# Patient Record
Sex: Female | Born: 1985 | Race: White | Hispanic: No | Marital: Married | Smoking: Former smoker
Health system: Southern US, Community
[De-identification: ages and names within clinical notes are randomized; demographics above are authoritative.]

## PROBLEM LIST (undated history)

## (undated) DIAGNOSIS — IMO0002 Reserved for concepts with insufficient information to code with codable children: Secondary | ICD-10-CM

## (undated) DIAGNOSIS — Z975 Presence of (intrauterine) contraceptive device: Secondary | ICD-10-CM

## (undated) DIAGNOSIS — Z8379 Family history of other diseases of the digestive system: Secondary | ICD-10-CM

## (undated) DIAGNOSIS — R87619 Unspecified abnormal cytological findings in specimens from cervix uteri: Secondary | ICD-10-CM

## (undated) DIAGNOSIS — B009 Herpesviral infection, unspecified: Secondary | ICD-10-CM

## (undated) DIAGNOSIS — R87629 Unspecified abnormal cytological findings in specimens from vagina: Secondary | ICD-10-CM

## (undated) DIAGNOSIS — K5900 Constipation, unspecified: Secondary | ICD-10-CM

## (undated) DIAGNOSIS — R42 Dizziness and giddiness: Secondary | ICD-10-CM

## (undated) HISTORY — DX: Dizziness and giddiness: R42

## (undated) HISTORY — DX: Unspecified abnormal cytological findings in specimens from cervix uteri: R87.619

## (undated) HISTORY — DX: Family history of other diseases of the digestive system: Z83.79

## (undated) HISTORY — DX: Presence of (intrauterine) contraceptive device: Z97.5

## (undated) HISTORY — PX: CERVICAL CONE BIOPSY: SUR198

## (undated) HISTORY — DX: Reserved for concepts with insufficient information to code with codable children: IMO0002

## (undated) HISTORY — PX: OTHER SURGICAL HISTORY: SHX169

## (undated) HISTORY — DX: Herpesviral infection, unspecified: B00.9

## (undated) HISTORY — DX: Unspecified abnormal cytological findings in specimens from vagina: R87.629

## (undated) HISTORY — DX: Constipation, unspecified: K59.00

---
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
## 2006-02-18 ENCOUNTER — Emergency Department (HOSPITAL_COMMUNITY): Admission: EM | Admit: 2006-02-18 | Discharge: 2006-02-19 | Payer: Self-pay | Admitting: Emergency Medicine

## 2006-03-02 ENCOUNTER — Ambulatory Visit: Payer: Self-pay | Admitting: Obstetrics & Gynecology

## 2006-03-30 ENCOUNTER — Ambulatory Visit: Payer: Self-pay | Admitting: Obstetrics & Gynecology

## 2006-04-27 ENCOUNTER — Ambulatory Visit (HOSPITAL_COMMUNITY): Admission: RE | Admit: 2006-04-27 | Discharge: 2006-04-27 | Payer: Self-pay | Admitting: *Deleted

## 2006-04-27 ENCOUNTER — Ambulatory Visit: Payer: Self-pay | Admitting: Obstetrics & Gynecology

## 2006-05-13 ENCOUNTER — Ambulatory Visit (HOSPITAL_COMMUNITY): Admission: RE | Admit: 2006-05-13 | Discharge: 2006-05-13 | Payer: Self-pay | Admitting: *Deleted

## 2006-05-25 ENCOUNTER — Ambulatory Visit: Payer: Self-pay | Admitting: Obstetrics & Gynecology

## 2006-06-16 ENCOUNTER — Ambulatory Visit: Payer: Self-pay | Admitting: *Deleted

## 2006-06-16 ENCOUNTER — Inpatient Hospital Stay (HOSPITAL_COMMUNITY): Admission: AD | Admit: 2006-06-16 | Discharge: 2006-06-16 | Payer: Self-pay | Admitting: Gynecology

## 2006-06-26 ENCOUNTER — Inpatient Hospital Stay (HOSPITAL_COMMUNITY): Admission: AD | Admit: 2006-06-26 | Discharge: 2006-06-26 | Payer: Self-pay | Admitting: Family Medicine

## 2006-06-29 ENCOUNTER — Ambulatory Visit: Payer: Self-pay | Admitting: Obstetrics & Gynecology

## 2006-07-13 ENCOUNTER — Ambulatory Visit: Payer: Self-pay | Admitting: Obstetrics & Gynecology

## 2006-08-03 ENCOUNTER — Ambulatory Visit: Payer: Self-pay | Admitting: Obstetrics & Gynecology

## 2006-08-12 ENCOUNTER — Inpatient Hospital Stay (HOSPITAL_COMMUNITY): Admission: AD | Admit: 2006-08-12 | Discharge: 2006-08-12 | Payer: Self-pay | Admitting: Obstetrics and Gynecology

## 2006-08-12 ENCOUNTER — Ambulatory Visit: Payer: Self-pay | Admitting: *Deleted

## 2006-08-12 ENCOUNTER — Inpatient Hospital Stay (HOSPITAL_COMMUNITY): Admission: AD | Admit: 2006-08-12 | Discharge: 2006-08-12 | Payer: Self-pay | Admitting: Family Medicine

## 2006-08-17 ENCOUNTER — Ambulatory Visit: Payer: Self-pay | Admitting: Obstetrics & Gynecology

## 2006-08-24 ENCOUNTER — Ambulatory Visit: Payer: Self-pay | Admitting: Obstetrics & Gynecology

## 2006-09-08 ENCOUNTER — Ambulatory Visit: Payer: Self-pay | Admitting: Family Medicine

## 2006-09-09 ENCOUNTER — Inpatient Hospital Stay (HOSPITAL_COMMUNITY): Admission: AD | Admit: 2006-09-09 | Discharge: 2006-09-09 | Payer: Self-pay | Admitting: Gynecology

## 2006-09-14 ENCOUNTER — Ambulatory Visit: Payer: Self-pay | Admitting: Obstetrics & Gynecology

## 2006-09-21 ENCOUNTER — Ambulatory Visit: Payer: Self-pay | Admitting: Obstetrics & Gynecology

## 2006-09-22 ENCOUNTER — Ambulatory Visit: Payer: Self-pay | Admitting: *Deleted

## 2006-09-22 ENCOUNTER — Inpatient Hospital Stay (HOSPITAL_COMMUNITY): Admission: AD | Admit: 2006-09-22 | Discharge: 2006-09-22 | Payer: Self-pay | Admitting: Family Medicine

## 2006-09-28 ENCOUNTER — Ambulatory Visit: Payer: Self-pay | Admitting: Obstetrics & Gynecology

## 2006-10-03 ENCOUNTER — Ambulatory Visit: Payer: Self-pay | Admitting: Obstetrics & Gynecology

## 2006-10-05 ENCOUNTER — Ambulatory Visit: Payer: Self-pay | Admitting: Obstetrics & Gynecology

## 2006-10-08 ENCOUNTER — Inpatient Hospital Stay (HOSPITAL_COMMUNITY): Admission: AD | Admit: 2006-10-08 | Discharge: 2006-10-12 | Payer: Self-pay | Admitting: Obstetrics & Gynecology

## 2006-10-08 ENCOUNTER — Ambulatory Visit: Payer: Self-pay | Admitting: Obstetrics and Gynecology

## 2006-10-09 ENCOUNTER — Encounter (INDEPENDENT_AMBULATORY_CARE_PROVIDER_SITE_OTHER): Payer: Self-pay | Admitting: Specialist

## 2006-10-15 ENCOUNTER — Inpatient Hospital Stay (HOSPITAL_COMMUNITY): Admission: AD | Admit: 2006-10-15 | Discharge: 2006-10-15 | Payer: Self-pay | Admitting: Gynecology

## 2006-10-15 ENCOUNTER — Ambulatory Visit: Payer: Self-pay | Admitting: Gynecology

## 2006-12-07 ENCOUNTER — Ambulatory Visit: Payer: Self-pay | Admitting: Family Medicine

## 2006-12-09 ENCOUNTER — Ambulatory Visit: Payer: Self-pay | Admitting: Family Medicine

## 2006-12-31 ENCOUNTER — Emergency Department (HOSPITAL_COMMUNITY): Admission: EM | Admit: 2006-12-31 | Discharge: 2006-12-31 | Payer: Self-pay | Admitting: Emergency Medicine

## 2007-02-06 ENCOUNTER — Emergency Department (HOSPITAL_COMMUNITY): Admission: EM | Admit: 2007-02-06 | Discharge: 2007-02-06 | Payer: Self-pay | Admitting: Emergency Medicine

## 2007-02-12 ENCOUNTER — Emergency Department (HOSPITAL_COMMUNITY): Admission: EM | Admit: 2007-02-12 | Discharge: 2007-02-12 | Payer: Self-pay | Admitting: Emergency Medicine

## 2007-03-14 ENCOUNTER — Emergency Department (HOSPITAL_COMMUNITY): Admission: EM | Admit: 2007-03-14 | Discharge: 2007-03-14 | Payer: Self-pay | Admitting: Emergency Medicine

## 2007-05-13 ENCOUNTER — Observation Stay (HOSPITAL_COMMUNITY): Admission: AD | Admit: 2007-05-13 | Discharge: 2007-05-14 | Payer: Self-pay | Admitting: Gynecology

## 2007-05-13 ENCOUNTER — Ambulatory Visit: Payer: Self-pay | Admitting: Gynecology

## 2007-06-12 ENCOUNTER — Ambulatory Visit (HOSPITAL_COMMUNITY): Admission: RE | Admit: 2007-06-12 | Discharge: 2007-06-12 | Payer: Self-pay | Admitting: Obstetrics

## 2007-08-17 ENCOUNTER — Ambulatory Visit: Admission: RE | Admit: 2007-08-17 | Discharge: 2007-08-17 | Payer: Self-pay | Admitting: Obstetrics

## 2007-10-06 ENCOUNTER — Inpatient Hospital Stay (HOSPITAL_COMMUNITY): Admission: AD | Admit: 2007-10-06 | Discharge: 2007-10-06 | Payer: Self-pay | Admitting: Obstetrics

## 2007-11-03 ENCOUNTER — Inpatient Hospital Stay (HOSPITAL_COMMUNITY): Admission: RE | Admit: 2007-11-03 | Discharge: 2007-11-05 | Payer: Self-pay | Admitting: Obstetrics & Gynecology

## 2008-02-28 ENCOUNTER — Emergency Department (HOSPITAL_COMMUNITY): Admission: EM | Admit: 2008-02-28 | Discharge: 2008-02-28 | Payer: Self-pay | Admitting: Family Medicine

## 2010-07-13 ENCOUNTER — Emergency Department (HOSPITAL_COMMUNITY): Admission: EM | Admit: 2010-07-13 | Discharge: 2010-07-13 | Payer: Self-pay | Admitting: Emergency Medicine

## 2010-10-08 ENCOUNTER — Other Ambulatory Visit: Admission: RE | Admit: 2010-10-08 | Discharge: 2010-10-08 | Payer: Self-pay | Admitting: Obstetrics and Gynecology

## 2010-11-16 ENCOUNTER — Emergency Department (HOSPITAL_COMMUNITY): Admission: EM | Admit: 2010-11-16 | Discharge: 2010-11-17 | Payer: Self-pay | Admitting: Emergency Medicine

## 2010-11-25 ENCOUNTER — Ambulatory Visit (HOSPITAL_COMMUNITY)
Admission: RE | Admit: 2010-11-25 | Discharge: 2010-11-25 | Payer: Self-pay | Source: Home / Self Care | Admitting: Family Medicine

## 2010-12-23 ENCOUNTER — Ambulatory Visit (HOSPITAL_COMMUNITY)
Admission: RE | Admit: 2010-12-23 | Discharge: 2010-12-23 | Payer: Self-pay | Source: Home / Self Care | Attending: Obstetrics & Gynecology | Admitting: Obstetrics & Gynecology

## 2011-03-01 LAB — COMPREHENSIVE METABOLIC PANEL
ALT: 15 U/L (ref 0–35)
AST: 20 U/L (ref 0–37)
Albumin: 4.3 g/dL (ref 3.5–5.2)
Alkaline Phosphatase: 46 U/L (ref 39–117)
BUN: 12 mg/dL (ref 6–23)
CO2: 29 mEq/L (ref 19–32)
Calcium: 9.4 mg/dL (ref 8.4–10.5)
Chloride: 101 mEq/L (ref 96–112)
Creatinine, Ser: 0.64 mg/dL (ref 0.4–1.2)
GFR calc Af Amer: >60 mL/min (ref >60)
GFR calc non Af Amer: >60 mL/min (ref >60)
Glucose, Bld: 83 mg/dL (ref 70–99)
Potassium: 3.7 mEq/L (ref 3.5–5.1)
Sodium: 138 mEq/L (ref 135–145)
Total Bilirubin: 0.6 mg/dL (ref 0.3–1.2)
Total Protein: 6.8 g/dL (ref 6.0–8.3)

## 2011-03-01 LAB — CBC
HCT: 37.3 % (ref 36.0–46.0)
Hemoglobin: 13 g/dL (ref 12.0–15.0)
MCH: 30.7 pg (ref 26.0–34.0)
MCHC: 34.9 g/dL (ref 30.0–36.0)
MCV: 88.2 fL (ref 78.0–100.0)
Platelets: 191 10*3/uL (ref 150–400)
RBC: 4.23 MIL/uL (ref 3.87–5.11)
RDW: 13.1 % (ref 11.5–15.5)
WBC: 6 10*3/uL (ref 4.0–10.5)

## 2011-03-01 LAB — URINALYSIS, ROUTINE W REFLEX MICROSCOPIC
Bilirubin Urine: NEGATIVE
Glucose, UA: NEGATIVE mg/dL
Hgb urine dipstick: NEGATIVE
Ketones, ur: NEGATIVE mg/dL
Nitrite: NEGATIVE
Protein, ur: NEGATIVE mg/dL
Specific Gravity, Urine: 1.015 (ref 1.005–1.030)
Urobilinogen, UA: 0.2 mg/dL (ref 0.0–1.0)
pH: 5.5 (ref 5.0–8.0)

## 2011-03-01 LAB — SURGICAL PCR SCREEN
MRSA, PCR: NEGATIVE
Staphylococcus aureus: NEGATIVE

## 2011-03-01 LAB — HCG, QUANTITATIVE, PREGNANCY: hCG, Beta Chain, Quant, S: <2 m[IU]/mL (ref <5)

## 2011-03-02 LAB — URINALYSIS, ROUTINE W REFLEX MICROSCOPIC
Bilirubin Urine: NEGATIVE
Glucose, UA: NEGATIVE mg/dL
Hgb urine dipstick: NEGATIVE
Ketones, ur: NEGATIVE mg/dL
Nitrite: NEGATIVE
Protein, ur: NEGATIVE mg/dL
Specific Gravity, Urine: 1.015 (ref 1.005–1.030)
Urobilinogen, UA: 0.2 mg/dL (ref 0.0–1.0)
pH: 7.5 (ref 5.0–8.0)

## 2011-03-02 LAB — WET PREP, GENITAL
Trich, Wet Prep: NONE SEEN
WBC, Wet Prep HPF POC: NONE SEEN
Yeast Wet Prep HPF POC: NONE SEEN

## 2011-03-02 LAB — GC/CHLAMYDIA PROBE AMP, GENITAL
Chlamydia, DNA Probe: NEGATIVE
GC Probe Amp, Genital: NEGATIVE

## 2011-03-06 LAB — PREGNANCY, URINE: Preg Test, Ur: NEGATIVE

## 2011-03-06 LAB — URINALYSIS, ROUTINE W REFLEX MICROSCOPIC
Bilirubin Urine: NEGATIVE
Glucose, UA: NEGATIVE mg/dL
Hgb urine dipstick: NEGATIVE
Ketones, ur: NEGATIVE mg/dL
Nitrite: NEGATIVE
Protein, ur: NEGATIVE mg/dL
Specific Gravity, Urine: 1.02 (ref 1.005–1.030)
Urobilinogen, UA: 0.2 mg/dL (ref 0.0–1.0)
pH: 6.5 (ref 5.0–8.0)

## 2011-03-06 LAB — URINE CULTURE
Colony Count: >=100000
Culture  Setup Time: 201107260130

## 2011-03-06 LAB — CBC
Platelets: 194 10*3/uL (ref 150–400)
RDW: 12.8 % (ref 11.5–15.5)
WBC: 8.3 10*3/uL (ref 4.0–10.5)

## 2011-03-06 LAB — COMPREHENSIVE METABOLIC PANEL
Albumin: 4 g/dL (ref 3.5–5.2)
Alkaline Phosphatase: 51 U/L (ref 39–117)
BUN: 11 mg/dL (ref 6–23)
GFR calc Af Amer: >60 mL/min (ref >60)
Potassium: 3.8 mEq/L (ref 3.5–5.1)
Sodium: 137 mEq/L (ref 135–145)
Total Protein: 6.9 g/dL (ref 6.0–8.3)

## 2011-03-06 LAB — GC/CHLAMYDIA PROBE AMP, GENITAL
Chlamydia, DNA Probe: NEGATIVE
GC Probe Amp, Genital: NEGATIVE

## 2011-03-06 LAB — DIFFERENTIAL
Basophils Relative: 0 % (ref 0–1)
Monocytes Absolute: 0.5 10*3/uL (ref 0.1–1.0)
Monocytes Relative: 6 % (ref 3–12)
Neutro Abs: 5.1 10*3/uL (ref 1.7–7.7)

## 2011-03-06 LAB — WET PREP, GENITAL: Trich, Wet Prep: NONE SEEN

## 2011-05-04 NOTE — Discharge Summary (Signed)
NAMECITLALI, Christy Wilkinson               ACCOUNT NO.:  [id number]   MEDICAL RECORD NO.:  [id number]          PATIENT TYPE:  OBV   LOCATION:  9320                          FACILITY:  [facility]   PHYSICIAN:  Ginger Carne, MD  DATE OF BIRTH:  Oct 04, 1986   DATE OF ADMISSION:  05/13/2007  DATE OF DISCHARGE:  05/14/2007                               DISCHARGE SUMMARY   REASON FOR HOSPITALIZATION:  The patient has 13-5/[redacted] weeks gestation with  bilateral pelvic pain.   IN-HOSPITAL PROCEDURES:  Obstetrical ultrasound.   FINAL DIAGNOSIS:  Constipation.   HOSPITAL COURSE:  This is a 25 year old gravida 3, para 1-0-1-1  Caucasian female EDC 11/13/2007 by ultrasound (13-5/[redacted] weeks gestation)  admitted with the acute onset of bilateral pelvic pain, right greater  than left.  Following use of two Colace stool softeners in the early  afternoon of 05/13/2007, the pain was followed by nausea but no vomiting  and no anorexia.  She has had no vaginal bleeding. The initial  laboratory values included with a white blood cell count of 18.2, H&H  35.8 and 12.0.  The remainder of her CMET was within normal limits.  Lipase and amylase were normal.  The patient was observed during the  evening and she remained afebrile.  Her pain abated.  She was hungry and  had no further symptomatology.  Her abdomen was soft without rebound  tenderness.  On presentation her abdomen was tender with minimal rebound  tenderness greater on the right than left side.  In the a.m. of  discharge her white blood cell count had dropped to 11.8.  Hemoglobin  was 11.0, hematocrit 31.1.   The patient was discharged with routine instructions including  contacting the office for temperature elevation above 100.4 degrees  Fahrenheit, increasing abdominal pain, pelvic pain, fevers, chills or  periumbilical pain.  She was advised not to use Colace two at once but  instead use one no more than twice a day with plenty fluids.  She is  awaiting her  Medicaid card and following this will follow-up with her  obstetrical care at the [facility] Department.  All  questions answered to the satisfaction of said patient and the patient  verbalized understanding of same. No medications prescribed.      Ginger Carne, MD  Electronically Signed     SHB/MEDQ  D:  05/14/2007  T:  05/14/2007  Job:  [id number]

## 2011-05-04 NOTE — Op Note (Signed)
Christy Wilkinson, Christy Wilkinson               ACCOUNT NO.:  [id number]   MEDICAL RECORD NO.:  [id number]          PATIENT TYPE:  INP   LOCATION:  9128                          FACILITY:  [facility]   PHYSICIAN:  Roseanna Rainbow, M.D.DATE OF BIRTH:  16-Apr-1986   DATE OF PROCEDURE:  11/03/2007  DATE OF DISCHARGE:                               OPERATIVE REPORT   PREOPERATIVE DIAGNOSES:  1. Uterine pregnancy at term.  2. History of a previous cesarean delivery, declines trial of labor.   POSTOPERATIVE DIAGNOSES:  1. Uterine pregnancy at term.  2. History of a previous cesarean delivery, declines trial of labor.  3. Uterine scar dehiscence.   SURGEON:  Dr. Tamela Oddi  Dr. Clearance Coots   ANESTHESIA:  Spinal.   ESTIMATED BLOOD LOSS:  600 mL   COMPLICATIONS:  None.   IV FLUID AND URINE OUTPUT:  As per anesthesiology.   PROCEDURE:  Primary lower uterine flap, elliptical cesarean delivery via  a Pfannenstiel skin incision.   SURGEON:  Roseanna Rainbow, M.D.   ANESTHESIA:  Epidural.   ESTIMATED BLOOD LOSS:  1 liter.   COMPLICATIONS:  None.   URINE OUTPUT AND IV FLUIDS:  As per anesthesiology.   PROCEDURE:  The patient was taken to the operating room with an IV  running.  A spinal anesthetic was then administered.  She was placed in  the dorsal supine position with a leftward tilt and prepped and draped  in the usual sterile fashion.  After a time out had been completed, a  Pfannenstiel skin incision was then made with a scalpel through the  previous scar and carried to the underlying fascia.  The fascia was  nicked in the midline.  The fascial incision was then extended  bilaterally with curved Mayo scissors.  The superior aspect of the  fascial incision was tented up and the underlying rectus muscles  dissected off.  The inferior aspect of the fascial incision was tented  up, and the underlying rectus muscles were dissected off.  The rectus  muscles were separated in the midline.   The parietal peritoneum was  entered.  This incision was then extended superiorly and inferiorly with  good visualization of the bladder.  There were filmy omental adhesions  that were divided with the Bovie to the parietal peritoneum.  At this  point, inspection of the lower uterine segment, there was complete  dehiscence of the previous C-section scar with serosa intact.  There was  also a fairly dense adhesion anterior to the round ligament on the right  side to the parietal peritoneum of the anterior abdominal wall.  This  was clamped and divided and suture ligated.  The flap was created  sharply.  The Alexis retractor was then placed into the incision.  The  lower uterine segment was incised in a transverse fashion.  The incision  was extended bluntly.  The infant's head was delivered atraumatically.  The oropharynx was suctioned with the bulb suction.  The cord was  clamped and cut.  The infant was handed over to the awaiting  neonatologist.  The placenta was then  removed.  The uterine incision was  then reapproximated in a running interlocking fashion using 0 Monocryl.  Second layer of the same was then used to place an imbricating layer.  The pericolic gutters were then irrigated.  Adequate hemostasis was  noted.  The Alexis retractor was then removed.  The parietal peritoneum  was reapproximated in the running fashion using 2-0 Vicryl.  The fascia  was closed in a running fashion using 0 PDS.  The skin was closed in a  subcuticular fashion using 3-0 Monocryl.  At the close of the procedure,  the instrument and pack counts were said to be correct x2.  Clindamycin  900 mg had been given at cord clamp.  The patient was taken to the PACU  awake and in stable condition.      Roseanna Rainbow, M.D.  Electronically Signed     LAJ/MEDQ  D:  11/03/2007  T:  11/04/2007  Job:  [id number]

## 2011-05-07 NOTE — H&P (Signed)
Christy Wilkinson, Christy Wilkinson               ACCOUNT NO.:  [id number]   MEDICAL RECORD NO.:  [id number]          PATIENT TYPE:  EMS   LOCATION:  MAJO                         FACILITY:  [facility]   PHYSICIAN:  Deirdre Peer. Polite, M.D. DATE OF BIRTH:  July 18, 1986   DATE OF ADMISSION:  01/17/2005  DATE OF DISCHARGE:                                HISTORY & PHYSICAL   CHIEF COMPLAINT:  Abdominal pain, fever.   HISTORY OF PRESENT ILLNESS:  This is a 25 year old female with no past  medical history presents to the ED with an approximately five day history of  abdominal discomfort in the lower pelvic area associated with yellow,  greenish vaginal discharge. The patient also has been having some nausea and  emesis over the last few days as well as diarrhea. The patient does admit to  having sexual intercourse with inconsistent condom use. Because of the above  symptoms the patient presented to the ED for evaluation. In the ED the  patient was evaluated and found to be afebrile, but tachycardia. She later  became febrile with a temperature of 102.6. Labs were ordered which showed  significant leukocytosis. The patient had a pelvic exam. Wet prep showed  moderate clue cells, many bacteria, no yeast. CT of the abdomen and pelvis  essentially negative. UA essentially negative. Admission is deemed necessary  for probable PID as the patient is having abdominal pain, abnormal pelvic  exam, and inability to keep p.o. down.   PAST MEDICAL HISTORY:  The patient denies any STDs in the past. Denies ever  having HIV testing. Does admit to inconsistent condom  use.   MEDICATIONS:  None.   SOCIAL HISTORY:  Positive for tobacco half pack a day. Social alcohol.  Social marijuana.   PAST SURGICAL HISTORY:  None.   ALLERGIES:  None.   FAMILY HISTORY:  Noncontributory.   REVIEW OF SYSTEMS:  As stated in the HPI.   PHYSICAL EXAMINATION:  GENERAL: The patient is alert and oriented times  three, very embarrassed about  probable diagnosis of PID.  VITAL SIGNS: Temperature 102.6, BP 122/41, pulse 120, respiratory rate 20,  saturation 100%.  HEENT: Within normal limits.  CHEST: Clear to auscultation.  CARDIOVASCULAR: Regular.  ABDOMEN: Soft, nontender. No masses appreciated.  EXTREMITIES: No clubbing, cyanosis, or edema.  PELVIC EXAM: Performed by ED staff.   DATA:  CBC reveals white count of 21.8, hemoglobin 13.9, platelet count  315,000.  BMET within normal limits. UA reveals specific gravity  1.029, LE  and nitrite negative. AST and ALT within normal limits. On wet prep, no  yeast seen, no Trichomonas seen. Moderate clue cells. Many white blood  cells. CT of the abdomen and pelvis revealed no acute disease.   ASSESSMENT:  1.  Pelvic inflammatory disease.  2.  Fever.  3.  Nausea, vomiting, and diarrhea.  4.  Dehydration.   RECOMMENDATIONS:  The patient is to be admitted to a medicine floor, start  on IV antibiotics, analgesia. The patient will be pancultured. Will follow  up on a Pap and pelvic done in the ED including gonorrhea and Chlamydia  studies. Recommend the patient be tested for HIV and syphilis. The patient  has been counseled past 15 to 20 minutes on the need for 100% latex condom  use or abstinence. Further, the patient has been given counsel on avoiding  situations where she feels like she is being pressured to have sex because  she did elude that at times she feels like she is being pressured to have  sex. The patient has been counseled in the presence of her 16 year old  sister. Will make further recommendation after review of the above studies.      RDP/MEDQ  D:  01/17/2005  T:  01/17/2005  Job:  [id number]

## 2011-05-07 NOTE — Discharge Summary (Signed)
Christy Wilkinson, Christy Wilkinson               ACCOUNT NO.:  [id number]   MEDICAL RECORD NO.:  [id number]          PATIENT TYPE:  INP   LOCATION:                                FACILITY:  [facility]   PHYSICIAN:  Lesly Dukes, M.D. DATE OF BIRTH:  [birth year]   DATE OF ADMISSION:  10/08/2006  DATE OF DISCHARGE:  10/12/2006                                 DISCHARGE SUMMARY   SERVICE:  OB teaching service.   NEW DISCHARGE DIAGNOSES:  1. G1, P1-0-0-1, status post a primary low transverse cesarean section at      40 weeks and 2 days gestational age.  2. Arrested descent.  3. Chorioamnionitis.  4. Postpartum anemia, status post transfusion.   PROCEDURES:  1. Induction of labor with pitocin.  2. Primary low transverse cesarean section on October 09, 2006.  3. Transfusion of packed red blood cells on October 11, 2006.   PRENATAL LABORATORY DATA:  1. Blood type O-positive, antibody screen negative.  2. Hemoglobin 12.8 and platelets 243.  3. Rubella immune.  4. Hepatitis B surface antigen negative.  5. RPR nonreactive.  6. GC and chlamydia negative.  7. GBS negative on August 12, 2006.  8. One-hour GTT 106.  9. Ultrasound on October 17 with an AFI of 17.   PERTINENT INPATIENT LABORATORY DATA:  1. Hemoglobin 11.6 on admission, 6.9 on October 22 with a low of 5.9 on      October 23, and 7.8 on date of discharge, status post transfusion.  2. White blood cells 11.6 on admission with a peak of 19.1 on October 22,      and 14.1 on date of discharge.  3. Platelets 146 and stable.  4. RPR nonreactive.   ADMISSION HISTORY:  Patient is a 25 year old G1, now P1-0-0-1, who presented  at 40 weeks and 2 days for scheduled induction of labor.  Her pregnancy  history was significant for a weight gain of roughly 60 pounds during the  gestation, and a history of borderline polyhydramnios that had resolved.  She was found to be 3/85/-2 and cephalic on admission with reassuring fetal  heart tracing.  She was  admitted to labor and delivery with favorable  cervix, and started on pitocin for induction of labor.   HOSPITAL COURSE:  Patient labored with an epidural for several hours, and  was complete for greater than 4 hours, and pushed for an hour and 45  minutes, but had arrest of descent at 0 to +1 station.  The patient also  spiked a fever to 101.7 prior to delivery, consistent with chorioamnionitis,  therefore she was started on gentamicin and clindamycin prior to C-section.  Please see Dr. Penne Lash and Dr. Yetta Barre' complete operative report for details,  but,  in brief, the patient had a primary low transverse C-section,  delivering a 9-pound 2-ounce female with postpartum uterine atony, requiring  both pitocin and Methergine administration.  Postoperatively, the patient  did spike a temperature to 101.9 on October 22, prompting continuation of  her gentamicin and clindamycin as well as the addition of ampicillin, which  she received  until the date of discharge.  She did also have some  intermittent abdominal pain likely due to gas distention, and some possible  blood collection in the uterus, but her symptoms improved gradually over the  postpartum course and she was afebrile for greater than 36 hours at the time  of discharge.   Postoperatively, the patient was found to be quite anemic with a hemoglobin  of 6.9 on the morning of October 22, with serial checks dropping as low as  5.9 on October 23.  At that point, patient was consented, and transfused 2  units of packed red cells without difficulty.  Her posttransfusion  hemoglobin rose to 7.8, and she had minimal lochia, and no other obvious  bleeding.  She remained relatively asymptomatic throughout her postpartum  course with regards to her anemia without significant complaints of  dizziness or lightheadedness, and was able to ambulate without difficulty in  her room.   Patient otherwise is bottle-feeding well and plans for an IUD at her  6-week  postpartum visit.  She was discharged with her staples in and with plans to  return to the MAU on October 27 for staple removal as her abdomen was still  quite distended both from some gas and also largely from the significant  weight gain she experienced during her pregnancy.  Routine newborn  precautions and postpartum depression precautions given prior to discharge.  All of patient and husband's concerns were addressed.   DISCHARGE CONDITION:  Stable   DISPOSITION:  Patient is discharged to home.   DISCHARGE MEDICATIONS:  1. Prenatal vitamins 1 tab p.o. daily.  2. Ibuprofen 600 mg p.o. q.6 hours p.r.n. pain.  3. Percocet 5/325 mg 1 to 2 tabs p.o. q.4-6 hours p.r.n. breakthrough      pain, dispensed #20 with no refills.  4. Colace 100 mg p.o. b.i.d. p.r.n. constipation.  5. Iron sulfate 325 mg p.o. b.i.d. on an empty stomach.   DISCHARGE INSTRUCTIONS:  Patient is to observe pelvic rest for 6 weeks.  Is  otherwise to resume activity as tolerated and continue with regular diet.  Routine newborn and postpartum depression precautions given prior to  discharge.   FOLLOWUP:  Patient is to return to the MAU on October 27 for staple removal,  and is then to follow up in 6 weeks at [facility] with Dr.  Johney Maine.     ______________________________  Carollee Herter, DO    ______________________________  Lesly Dukes, M.D.   EC/MEDQ  D:  10/12/2006  T:  10/13/2006  Job:  [id number]

## 2011-05-07 NOTE — Op Note (Signed)
NAME:  Christy Wilkinson, Christy Wilkinson               ACCOUNT NO.:  [id number]   MEDICAL RECORD NO.:  [id number]          PATIENT TYPE:  INP   LOCATION:  9104                          FACILITY:  [facility]   PHYSICIAN:  Lesly Dukes, M.D. DATE OF BIRTH:  04/19/1986   DATE OF PROCEDURE:  10/09/2006  DATE OF DISCHARGE:                                 OPERATIVE REPORT   PREOPERATIVE DIAGNOSIS:  Intrauterine pregnancy at term, arrest of second  stage, chorioamnionitis.   POSTOPERATIVE DIAGNOSIS:  Intrauterine pregnancy at term, arrest of second  stage.   OPERATION PERFORMED:  Primary low transverse cesarean section.   SURGEON:  Lesly Dukes, M.D.   ASSISTANT:  Paticia Stack, MD   ANESTHESIA:  Epidural.   SPECIMENS:  Placenta to L&D.   ESTIMATED BLOOD LOSS:  1000 mL.   COMPLICATIONS:  None.   FINDINGS:  Viable female infant, birth weight 9 pounds 2 ounces, Apgars 8  and 9 at one and five minutes, respectively.   INDICATIONS FOR PROCEDURE:  Patient is a 25 year old gravida 1, para 0 at 29  and 2/[redacted] weeks gestation, who underwent induction of labor.  Patient got to  complete dilation for four hours pushed for one hour 45 minutes with no  improvement in station.  Fetal heart tones are reassuring.  Patient also  developed chorioamnionitis and was taken for cesarean section.   DESCRIPTION OF PROCEDURE:  The patient was taken to the operating room where  epidural anesthesia was found to be adequate.  She was then prepped and  draped in the normal sterile fashion in dorsal supine position with a  leftward tilt.  The __________ skin incision was then made with a scalpel  and carried through to the underlying layer of fascia.  The fascia was  incised into midline and incision extended laterally with the Mayo scissors.  The superior aspect of the fascial incision was then grasped with the Kocher  clamps, elevated and the underlying rectus muscle was dissected off bluntly.  Attention was then turned to  the inferior aspect of the incision which in a  similar fashion was grasped, tented up with the Kocher clamp and the rectus  muscle was dissected off bluntly.  The rectus muscles were separated in the  midline, the peritoneum identified, tented up and entered sharply with the  Metzenbaum scissors.  The peritoneal incision was then extended with good  visualization of the bladder.  The bladder blade was inserted and the  vesicouterine peritoneum identified, grasped with pickups and entered  sharply with Metzenbaum scissors.  This incision was then extended laterally  and bladder flap created digitally __________ The bladder blade was then  reinserted and the lower uterine segment incised in a transverse fashion  with the scalpel.  The bladder blade was removed and the infant's head  delivered atraumatically.  The nose and mouth were suctioned with the bulb  and the cord clamped and cut.  The infant was handed off to the waiting  pediatricians.  Cord bloods were sent.  The placenta was then removed  manually.  The uterus exteriorized and  cleared of clots and debris. The  uterine incision was repaired with 1-0 chromic in a running locked fashion.  Pressure was applied to the uterine incision with good hemostasis.  The  uterus was returned to the abdomen and the uterine incision was inspected  again with good hemostasis.  The gutters were cleared of all clots and the  fascia was reapproximated with 0 Vicryl in a running fashion.  The skin was  closed with staples.  The patient tolerated the procedure well.  Sponge,  lap, and needle  counts were correct times two.  The patient was taken to  recovery room in stable condition.     ______________________________  Paticia Stack, MD    ______________________________  Lesly Dukes, M.D.    LNJ/MEDQ  D:  10/09/2006  T:  10/10/2006  Job:  [id number]

## 2011-05-07 NOTE — Discharge Summary (Signed)
[name], Christy Wilkinson               ACCOUNT NO.:  [id number]   MEDICAL RECORD NO.:  [id number]          PATIENT TYPE:  INP   LOCATION:  9128                          [facility]ACILITY:  [facility]   PHYSICIAN:  Roseanna Rainbow, M.D.DATE OF BIRTH:  1986-03-20   DATE OF ADMISSION:  11/03/2007  DATE OF DISCHARGE:  11/05/2007                               DISCHARGE SUMMARY   CHIEF COMPLAINT:  The patient is a 25 year old para 1 with an estimated  date of confinement of November 05, 2007 with a history of a previous  cesarean delivery, now for a repeat cesarean delivery.   HISTORY OF PRESENT ILLNESS:  Please see the above.   OBSTETRICAL RISK FACTORS:  Please see the above.  She has history of a  large for gestational age infant.   ALLERGIES:  1. PENICILLIN.  2. ZITHROMAX.   MEDICATIONS:  Please see the reconciliation form.   PAST GYNECOLOGIC HISTORY:  She denies.   PAST MEDICAL HISTORY:  She denies.   PAST SURGICAL HISTORY:  She denies.   FAMILY HISTORY:  Crohn's disease.   PAST OBSTETRICAL HISTORY:  In 2006, there was a spontaneous abortion.  In October of 2007, there is a cesarean delivery for a live born infant,  9 pounds 2 ounces.   PRENATAL LABORATORIES:  Hepatitis B surface antigen negative, rubella  immune, blood type O+, antibody screen negative.  Hemoglobin 12.5,  hematocrit 38.8, platelets 285,000.  HIV nonreactive.  RPR nonreactive.  Quad screen normal.  Pap smear negative.  GC and Chlamydia DNA probes  negative.  Urine culture and sensitivity no growth.   PHYSICAL EXAMINATION:  VITAL SIGNS:  Stable, afebrile.  GENERAL APPEARANCE:  In no apparent distress.  HEENT:  Normocephalic, atraumatic.  NECK:  Supple.  LUNGS:  Clear to auscultation bilaterally.  HEART:  Regular rate and rhythm.  ABDOMEN:  Gravid, nontender.  PELVIC:  Deferred.  EXTREMITIES:  Trace edema.  SKIN:  No rash.   ASSESSMENT:  Intrauterine pregnancy at term.  History of a previous  cesarean delivery.   Declines trial of labor.   PLAN:  Admission for repeat cesarean delivery.   HOSPITAL COURSE:  The patient was admitted and underwent a repeat  cesarean delivery.  Please see the dictated operative summary.  On  postoperative day #1, her hemoglobin was 10.2.  The remainder of her  hospital course was uneventful.  She was discharged to home on  postoperative day #2 tolerating a regular diet.   DISCHARGE DIAGNOSES:  1. Intrauterine pregnancy at term.  2. History of previous cesarean delivery.   PROCEDURE:  Repeat cesarean delivery.   CONDITION:  Good.   DIET:  Regular.   ACTIVITY:  Pelvic rest, progressive activity.   MEDICATIONS:  1. Percocet.  2. Ibuprofen.   DISPOSITION:  The patient was to follow up in the office in 2 weeks.      Roseanna Rainbow, M.D.  Electronically Signed     LAJ/MEDQ  D:  11/23/2007  T:  11/23/2007  Job:  [id number]

## 2011-07-01 ENCOUNTER — Encounter (INDEPENDENT_AMBULATORY_CARE_PROVIDER_SITE_OTHER): Payer: Self-pay

## 2011-07-15 NOTE — Progress Notes (Unsigned)
Subjective:     Patient ID: Christy Wilkinson, female   DOB: 30-Apr-1986, 25 y.o.   MRN: [id number]  HPIReferral from Dr. Renard Matter for family hx of Crohn's   Review of Systems     Objective:   Physical Exam     Assessment:     ***    Plan:     ***

## 2011-08-30 ENCOUNTER — Encounter (INDEPENDENT_AMBULATORY_CARE_PROVIDER_SITE_OTHER): Payer: Self-pay | Admitting: Internal Medicine

## 2011-08-30 ENCOUNTER — Ambulatory Visit (INDEPENDENT_AMBULATORY_CARE_PROVIDER_SITE_OTHER): Payer: Medicaid Other | Admitting: Internal Medicine

## 2011-08-30 DIAGNOSIS — R109 Unspecified abdominal pain: Secondary | ICD-10-CM

## 2011-08-30 DIAGNOSIS — K5900 Constipation, unspecified: Secondary | ICD-10-CM

## 2011-08-30 LAB — CBC WITH DIFFERENTIAL/PLATELET
Basophils Absolute: 0 10*3/uL (ref 0.0–0.1)
Basophils Relative: 0 % (ref 0–1)
Eosinophils Absolute: 0.1 10*3/uL (ref 0.0–0.7)
MCH: 30.6 pg (ref 26.0–34.0)
MCHC: 33.7 g/dL (ref 30.0–36.0)
Neutrophils Relative %: 60 % (ref 43–77)
Platelets: 187 10*3/uL (ref 150–400)
RBC: 4.47 MIL/uL (ref 3.87–5.11)
RDW: 12.6 % (ref 11.5–15.5)

## 2011-08-30 NOTE — Patient Instructions (Signed)
Amitiza BID.  Try using a stool softner. Increase fiber in diet. OV in 1 monthn

## 2011-08-30 NOTE — Progress Notes (Signed)
Subjective:     Patient ID: Christy Wilkinson, female   DOB: 03-27-1986, 25 y.o.   MRN: [id number]  HPI Christy Wilkinson is a 25 yr old white female referred to our office by Dr. Renard Matter for abdominal pain.  In the morning she has pain across her mid abdomen. She also tells me she has been constipated for about a year.  She says she can go for 2 weeks without a BM.  She has tried Orange juice and coffee to have a BM.  She has tried Vitamin B which made her nauseated.  She usually has a BM about 2 times every 2 weeks  Stools are brown. Normal size.  Sometimes her stools are small if she is constipated. Appetite is good. No weight loss.  No rectal bleeding or melena. She was placed on Bentyl by Dr. Renard Matter and it helped.  She also c/o lower abdominal pain. Pain is usually in the morning and is usually relieved having a BM.  Review of Systems see hpi No current outpatient prescriptions on file.   No meds   Past Medical History  Diagnosis Date  . Family history of Crohn's disease     Mother   History   Social History  . Marital Status: Single    Spouse Name: N/A    Number of Children: N/A  . Years of Education: N/A   Occupational History  . Not on file.   Social History Main Topics  . Smoking status: Current Some Day Smoker  . Smokeless tobacco: Not on file   Comment: twice a week  . Alcohol Use: Yes     occasionally  . Drug Use: No  . Sexually Active: Not on file   Other Topics Concern  . Not on file   Social History Narrative  . No narrative on file        Objective:   Physical Exam Filed Vitals:   08/30/11 1048  Height: 5\' 3"  (1.6 m)  Weight: 138 lb 11.2 oz (62.914 kg)   Filed Vitals:   08/30/11 1048  BP: 98/54  Pulse: 72  Temp: 98 F (36.7 C)    Alert and oriented. Skin warm and dry. Oral mucosa is moist. Natural teeth in good condition. Sclera anicteric, conjunctivae is pink. Thyroid not enlarged. No cervical lymphadenopathy. Lungs clear. Heart regular rate and rhythm.   Abdomen is soft. Bowel sounds are positive. No hepatomegaly. No abdominal masses felt .   Slight tenderness left lower abdomen. No edema to lower extremities. Patient is alert and oriented.      Assessment:    Constipation. Family hx of Crohn's.  Constipation x 1 year since the birth of her child.  As far as family hx of Crohn's, I do not think she has Crohn's disease.  I will however get a CBC and CRP on her.    Plan:     Will try her on  Amitiza BID.Stool softner BID. Will also get a CBC and CRP.  She will call with a Progress report in 2 weeks. OV in 1 month.

## 2011-09-27 ENCOUNTER — Encounter (INDEPENDENT_AMBULATORY_CARE_PROVIDER_SITE_OTHER): Payer: Self-pay | Admitting: *Deleted

## 2011-09-27 ENCOUNTER — Ambulatory Visit (INDEPENDENT_AMBULATORY_CARE_PROVIDER_SITE_OTHER): Payer: Medicaid Other | Admitting: Internal Medicine

## 2011-09-27 ENCOUNTER — Ambulatory Visit (INDEPENDENT_AMBULATORY_CARE_PROVIDER_SITE_OTHER): Payer: Self-pay | Admitting: Internal Medicine

## 2011-09-28 LAB — CBC
HCT: 35.8 — ABNORMAL LOW
Hemoglobin: 12.4
Platelets: 159
WBC: 10.9 — ABNORMAL HIGH
WBC: 8.5

## 2011-10-12 ENCOUNTER — Other Ambulatory Visit: Payer: Self-pay | Admitting: Adult Health

## 2011-10-12 ENCOUNTER — Other Ambulatory Visit (HOSPITAL_COMMUNITY)
Admission: RE | Admit: 2011-10-12 | Discharge: 2011-10-12 | Disposition: A | Discharge status: Home / Self Care | Payer: Medicaid Other | Source: Ambulatory Visit | Attending: Obstetrics and Gynecology | Admitting: Obstetrics and Gynecology

## 2011-10-12 DIAGNOSIS — Z01419 Encounter for gynecological examination (general) (routine) without abnormal findings: Secondary | ICD-10-CM | POA: Insufficient documentation

## 2011-11-23 ENCOUNTER — Emergency Department (HOSPITAL_COMMUNITY)
Admission: EM | Admit: 2011-11-23 | Discharge: 2011-11-23 | Disposition: A | Discharge status: Home / Self Care | Payer: Medicaid Other | Attending: Emergency Medicine | Admitting: Emergency Medicine

## 2011-11-23 ENCOUNTER — Emergency Department (HOSPITAL_COMMUNITY): Payer: Medicaid Other

## 2011-11-23 ENCOUNTER — Encounter (HOSPITAL_COMMUNITY): Payer: Self-pay | Admitting: *Deleted

## 2011-11-23 DIAGNOSIS — F17200 Nicotine dependence, unspecified, uncomplicated: Secondary | ICD-10-CM | POA: Insufficient documentation

## 2011-11-23 DIAGNOSIS — IMO0002 Reserved for concepts with insufficient information to code with codable children: Secondary | ICD-10-CM | POA: Insufficient documentation

## 2011-11-23 DIAGNOSIS — S9000XA Contusion of unspecified ankle, initial encounter: Secondary | ICD-10-CM | POA: Insufficient documentation

## 2011-11-23 MED ORDER — HYDROCODONE-ACETAMINOPHEN 5-325 MG PO TABS: ORAL_TABLET | ORAL | Status: DC

## 2011-11-23 MED ORDER — HYDROCODONE-ACETAMINOPHEN 5-325 MG PO TABS
1.0000 | ORAL_TABLET | Freq: Once | ORAL | Status: AC
  Administered 2011-11-23: 1 via ORAL

## 2011-11-23 MED ORDER — IBUPROFEN 800 MG PO TABS: 800.0000 mg | ORAL_TABLET | Freq: Once | ORAL | Status: DC

## 2011-11-23 MED ORDER — HYDROCODONE-ACETAMINOPHEN 5-325 MG PO TABS
1.0000 | ORAL_TABLET | Freq: Once | ORAL | Status: DC
  Filled 2011-11-23: qty 1

## 2011-11-23 MED ORDER — IBUPROFEN 800 MG PO TABS
800.0000 mg | ORAL_TABLET | Freq: Once | ORAL | Status: AC
  Administered 2011-11-23: 800 mg via ORAL
  Filled 2011-11-23: qty 1

## 2011-11-23 NOTE — ED Notes (Signed)
C/o left foot pain onset 4 days ago-reports was moving a dryer on a dolly and the dryer slipped and landed on her left foot

## 2011-11-23 NOTE — ED Provider Notes (Signed)
Medical screening examination/treatment/procedure(s) were performed by non-physician practitioner and as supervising physician I was immediately available for consultation/collaboration.   Benny Lennert, MD 11/23/11 [phone]

## 2011-11-23 NOTE — ED Provider Notes (Signed)
History     CSN: [id number] Arrival date & time: 11/23/2011  9:16 PM   None     Chief Complaint  Patient presents with  . Foot Pain    (Consider location/radiation/quality/duration/timing/severity/associated sxs/prior treatment) Patient is a 25 y.o. female presenting with lower extremity pain. The history is provided by the patient. No language interpreter was used.  Foot Pain This is a new problem. Episode onset: 2 days ago. The problem occurs constantly. The problem has been gradually worsening. The symptoms are aggravated by walking, twisting and standing. She has tried NSAIDs for the symptoms. The treatment provided no relief.    Past Medical History  Diagnosis Date  . Family history of Crohn's disease     Mother    Past Surgical History  Procedure Date  . Gyn procedure for cervical cancer     scrapping    No family history on file.  History  Substance Use Topics  . Smoking status: Current Some Day Smoker    Types: Cigarettes  . Smokeless tobacco: Not on file   Comment: twice a week  . Alcohol Use: Yes     occasionally    OB History    Grav Para Term Preterm Abortions TAB SAB Ect Mult Living                  Review of Systems  Musculoskeletal:       Ankle injury   All other systems reviewed and are negative.    Allergies  Zithromax  Home Medications   Current Outpatient Rx  Name Route Sig Dispense Refill  . IBUPROFEN 200 MG PO TABS Oral Take 200 mg by mouth once as needed. For pain     . LEVONORGESTREL 20 MCG/24HR IU IUD Intrauterine 1 each by Intrauterine route once.        BP 120/60  Pulse 101  Temp(Src) 98.5 F (36.9 C) (Oral)  Resp 16  Ht 5\' 3"  (1.6 m)  Wt 137 lb (62.143 kg)  BMI 24.27 kg/m2  SpO2 100%  LMP 11/03/2011  Physical Exam  Nursing note and vitals reviewed. Constitutional: She is oriented to person, place, and time. She appears well-developed and well-nourished. No distress.  HENT:  Head: Normocephalic and  atraumatic.  Eyes: EOM are normal.  Neck: Normal range of motion.  Cardiovascular: Normal rate, regular rhythm and normal heart sounds.   Pulmonary/Chest: Effort normal and breath sounds normal.  Abdominal: Soft. She exhibits no distension. There is no tenderness.  Musculoskeletal: She exhibits tenderness.       Left ankle: She exhibits decreased range of motion, swelling and ecchymosis. She exhibits no deformity, no laceration and normal pulse. tenderness. Medial malleolus tenderness found. Achilles tendon normal. Achilles tendon exhibits normal Thompson's test results.       Feet:  Neurological: She is alert and oriented to person, place, and time.  Skin: Skin is warm and dry.  Psychiatric: She has a normal mood and affect. Judgment normal.    ED Course  Procedures (including critical care time)  Labs Reviewed - No data to display Dg Ankle Complete Left  11/23/2011  *RADIOLOGY REPORT*  Clinical Data: Ankle injury 3 days ago with pain and bruising  LEFT ANKLE COMPLETE - 3+ VIEW  Comparison: 06/01/2005  Findings: Normal alignment and no fracture.  Previously identified avulsion fracture of the tip of the fibula has healed.  No joint effusion or degenerative change.  IMPRESSION: Negative  Original Report Authenticated By: Camelia Phenes, M.D.  Dg Foot Complete Left  11/23/2011  *RADIOLOGY REPORT*  Clinical Data: Injury 3 days ago with pain and bruising  LEFT FOOT - COMPLETE 3+ VIEW  Comparison: None.  Findings: Negative for fracture.  No significant arthropathy.  IMPRESSION: Negative  Original Report Authenticated By: Camelia Phenes, M.D.     No diagnosis found.    MDM          Worthy Rancher, PA 11/23/11 [phone]

## 2013-03-01 ENCOUNTER — Telehealth (INDEPENDENT_AMBULATORY_CARE_PROVIDER_SITE_OTHER): Payer: Self-pay | Admitting: *Deleted

## 2013-03-01 NOTE — Telephone Encounter (Signed)
Continued form last 2 notes

## 2013-03-01 NOTE — Telephone Encounter (Signed)
Constance Haw, patient's mother, would like for Dr. Karilyn Cota to please return her call. It is about something did or said during her daughters office visit. Inetta Fermo would not give any other information. She can be reached at [phone].

## 2013-03-01 NOTE — Telephone Encounter (Signed)
Christy Wilkinson , Please make her an appointment   Thank -You

## 2013-03-01 NOTE — Telephone Encounter (Signed)
I spoke with patient's mother,Tina. She states that her daughter was seen by Camelia Eng last September by Camelia Eng. Lab work was done. (CRP,CBC/D) Patient was given Amitiza , ect she was told that her symtoms were not that of Crohn's Disease Deletha did not keep her follow up visit. Inetta Fermo wants Dr.Rehman to see her. Will discuss with Dr.Rehman

## 2013-03-01 NOTE — Telephone Encounter (Signed)
Dr.Rehman states that the patient needs an office appointment with him. Inetta Fermo has not called back with her daughter's new cell number as of yet

## 2013-03-01 NOTE — Telephone Encounter (Signed)
Christy Wilkinson called back and said Shirly's cell phone number is [phone].

## 2013-03-01 NOTE — Telephone Encounter (Signed)
Forward to [place] [place] please see previous note from me Thank You

## 2013-03-02 NOTE — Telephone Encounter (Signed)
Noted  

## 2013-03-02 NOTE — Telephone Encounter (Signed)
Per Verlon Au, she doesn't have any insurance at this time and wishes to wait. Once she gets insurance, she will call back to schedule an apt.

## 2013-05-18 ENCOUNTER — Encounter: Payer: Self-pay | Admitting: *Deleted

## 2013-05-18 DIAGNOSIS — B009 Herpesviral infection, unspecified: Secondary | ICD-10-CM

## 2013-05-21 ENCOUNTER — Ambulatory Visit: Payer: Medicaid Other | Admitting: Obstetrics and Gynecology

## 2013-05-21 ENCOUNTER — Encounter: Payer: Self-pay | Admitting: Obstetrics and Gynecology

## 2013-05-21 ENCOUNTER — Ambulatory Visit (INDEPENDENT_AMBULATORY_CARE_PROVIDER_SITE_OTHER): Payer: Medicaid Other | Admitting: Obstetrics and Gynecology

## 2013-05-21 ENCOUNTER — Other Ambulatory Visit (HOSPITAL_COMMUNITY)
Admission: RE | Admit: 2013-05-21 | Discharge: 2013-05-21 | Disposition: A | Discharge status: Home / Self Care | Payer: Medicaid Other | Source: Ambulatory Visit | Attending: Obstetrics and Gynecology | Admitting: Obstetrics and Gynecology

## 2013-05-21 VITALS — BP 102/70 | Ht 62.0 in | Wt 153.8 lb

## 2013-05-21 DIAGNOSIS — Z3049 Encounter for surveillance of other contraceptives: Secondary | ICD-10-CM

## 2013-05-21 DIAGNOSIS — Z113 Encounter for screening for infections with a predominantly sexual mode of transmission: Secondary | ICD-10-CM | POA: Insufficient documentation

## 2013-05-21 DIAGNOSIS — Z0000 Encounter for general adult medical examination without abnormal findings: Secondary | ICD-10-CM

## 2013-05-21 DIAGNOSIS — Z01419 Encounter for gynecological examination (general) (routine) without abnormal findings: Secondary | ICD-10-CM

## 2013-05-21 DIAGNOSIS — Z3009 Encounter for other general counseling and advice on contraception: Secondary | ICD-10-CM

## 2013-05-21 NOTE — Progress Notes (Signed)
  Assessment:  Normal Gyn Exam Contraception management IUD needs exchange Cervical fibrosis, s/p leep   Plan:  1. Return 1 wk for removal of IUD and replacement. Will need paracervical block, room2 and IUD extractor 2. pap smear done, next pap due 1 yr 3. return annually or prn  Subjective:  Christy Wilkinson is a 27 y.o. female G3P2 who presents for annual exam. And Fam Planning medicaid The patient has complaints today of needs IUD replaced, had string lost at LEEP  The following portions of the patient's history were reviewed and updated as appropriate: allergies, current medications, past family history, past medical history, past social history, past surgical history and problem list.  Review of Systems Pertinent items are noted in HPI.  Objective:  BP 102/70  Ht 5\' 2"  (1.575 m)  Wt 153 lb 12.8 oz (69.763 kg)  BMI 28.12 kg/m2  LMP 05/08/2013  BMI: Body mass index is 28.12 kg/(m^2). General Appearance: Alert, appropriate appearance for age. No acute distress HEENT: Grossly normal Neck / Thyroid:  Cardiovascular: RRR; normal S1, S2, no murmur Lungs: CTA bilaterally Back: No CVAT Breast Exam: normal , mobile nontender, no massses Gastrointestinal: Soft, non-tender, no masses or organomegaly Pelvic Exam: Vulva and vagina appear normal. Bimanual exam reveals normal uterus and adnexa. External genitalia: normal general appearance Rectovaginal: not indicated Lymphatic Exam: Non-palpable nodes in neck, clavicular, axillary, or inguinal regions Skin: no rash or abnormalities Neurologic: Normal gait and speech, no tremor  Psychiatric: Alert and oriented, appropriate affect. Cervix is fibrotic and stenotic s/p leep. String not visible. Will need paracervical block Urinalysis:negative  Christin Bach. MD Pgr [phone] 4:14 PM

## 2013-05-21 NOTE — Patient Instructions (Signed)
followup one week fo r iud removal and replacement. Take Motrin 400 mg one hour before next visit

## 2013-05-22 LAB — HEPATITIS C ANTIBODY: HCV Ab: NEGATIVE

## 2013-05-22 LAB — HEPATITIS B SURFACE ANTIGEN: Hepatitis B Surface Ag: NEGATIVE

## 2013-06-04 ENCOUNTER — Ambulatory Visit (INDEPENDENT_AMBULATORY_CARE_PROVIDER_SITE_OTHER): Payer: Medicaid Other | Admitting: Women's Health

## 2013-06-04 ENCOUNTER — Encounter: Payer: Self-pay | Admitting: Women's Health

## 2013-06-04 ENCOUNTER — Ambulatory Visit: Payer: Medicaid Other | Admitting: Obstetrics and Gynecology

## 2013-06-04 VITALS — BP 90/62 | Ht 63.0 in | Wt 152.4 lb

## 2013-06-04 DIAGNOSIS — Z3049 Encounter for surveillance of other contraceptives: Secondary | ICD-10-CM

## 2013-06-04 DIAGNOSIS — Z3202 Encounter for pregnancy test, result negative: Secondary | ICD-10-CM

## 2013-06-04 DIAGNOSIS — T8332XA Displacement of intrauterine contraceptive device, initial encounter: Secondary | ICD-10-CM

## 2013-06-04 DIAGNOSIS — Z30433 Encounter for removal and reinsertion of intrauterine contraceptive device: Secondary | ICD-10-CM

## 2013-06-04 LAB — POCT URINE PREGNANCY: Preg Test, Ur: NEGATIVE

## 2013-06-04 MED ORDER — DOXYCYCLINE HYCLATE 100 MG PO CAPS: 100.0000 mg | ORAL_CAPSULE | Freq: Two times a day (BID) | ORAL | Status: DC

## 2013-06-04 NOTE — Patient Instructions (Signed)
Levonorgestrel intrauterine device (IUD) What is this medicine? LEVONORGESTREL IUD (LEE voe nor jes trel) is a contraceptive (birth control) device. The device is placed inside the uterus by a healthcare professional. It is used to prevent pregnancy and can also be used to treat heavy bleeding that occurs during your period. Depending on the device, it can be used for 3 to 5 years. This medicine may be used for other purposes; ask your health care provider or pharmacist if you have questions. What should I tell my health care provider before I take this medicine? They need to know if you have any of these conditions: -abnormal Pap smear -cancer of the breast, uterus, or cervix -diabetes -endometritis -genital or pelvic infection now or in the past -have more than one sexual partner or your partner has more than one partner -heart disease -history of an ectopic or tubal pregnancy -immune system problems -IUD in place -liver disease or tumor -problems with blood clots or take blood-thinners -use intravenous drugs -uterus of unusual shape -vaginal bleeding that has not been explained -an unusual or allergic reaction to levonorgestrel, other hormones, silicone, or polyethylene, medicines, foods, dyes, or preservatives -pregnant or trying to get pregnant -breast-feeding How should I use this medicine? This device is placed inside the uterus by a health care professional. Talk to your pediatrician regarding the use of this medicine in children. Special care may be needed. Overdosage: If you think you have taken too much of this medicine contact a poison control center or emergency room at once. NOTE: This medicine is only for you. Do not share this medicine with others. What if I miss a dose? This does not apply. What may interact with this medicine? Do not take this medicine with any of the following medications: -amprenavir -bosentan -fosamprenavir This medicine may also interact with  the following medications: -aprepitant -barbiturate medicines for inducing sleep or treating seizures -bexarotene -griseofulvin -medicines to treat seizures like carbamazepine, ethotoin, felbamate, oxcarbazepine, phenytoin, topiramate -modafinil -pioglitazone -rifabutin -rifampin -rifapentine -some medicines to treat HIV infection like atazanavir, indinavir, lopinavir, nelfinavir, tipranavir, ritonavir -St. John's wort -warfarin This list may not describe all possible interactions. Give your health care provider a list of all the medicines, herbs, non-prescription drugs, or dietary supplements you use. Also tell them if you smoke, drink alcohol, or use illegal drugs. Some items may interact with your medicine. What should I watch for while using this medicine? Visit your doctor or health care professional for regular check ups. See your doctor if you or your partner has sexual contact with others, becomes HIV positive, or gets a sexual transmitted disease. This product does not protect you against HIV infection (AIDS) or other sexually transmitted diseases. You can check the placement of the IUD yourself by reaching up to the top of your vagina with clean fingers to feel the threads. Do not pull on the threads. It is a good habit to check placement after each menstrual period. Call your doctor right away if you feel more of the IUD than just the threads or if you cannot feel the threads at all. The IUD may come out by itself. You may become pregnant if the device comes out. If you notice that the IUD has come out use a backup birth control method like condoms and call your health care provider. Using tampons will not change the position of the IUD and are okay to use during your period. What side effects may I notice from receiving this medicine?   Side effects that you should report to your doctor or health care professional as soon as possible: -allergic reactions like skin rash, itching or  hives, swelling of the face, lips, or tongue -fever, flu-like symptoms -genital sores -high blood pressure -no menstrual period for 6 weeks during use -pain, swelling, warmth in the leg -pelvic pain or tenderness -severe or sudden headache -signs of pregnancy -stomach cramping -sudden shortness of breath -trouble with balance, talking, or walking -unusual vaginal bleeding, discharge -yellowing of the eyes or skin Side effects that usually do not require medical attention (report to your doctor or health care professional if they continue or are bothersome): -acne -breast pain -change in sex drive or performance -changes in weight -cramping, dizziness, or faintness while the device is being inserted -headache -irregular menstrual bleeding within first 3 to 6 months of use -nausea This list may not describe all possible side effects. Call your doctor for medical advice about side effects. You may report side effects to FDA at 1-800-FDA-1088. Where should I keep my medicine? This does not apply. NOTE: This sheet is a summary. It may not cover all possible information. If you have questions about this medicine, talk to your doctor, pharmacist, or health care provider.  2013, Elsevier/Gold Standard. (01/06/2012 1:54:04 PM)  

## 2013-06-04 NOTE — Progress Notes (Addendum)
  Christy Wilkinson is a 28 y.o. year old Caucasian female who presents for removal of existing Mirena IUD and placement of a new Mirena IUD. Pt states strings were accidentally burned during laser ablation of cervix last year.   The risks and benefits of the method and placement have been thouroughly reviewed with the patient and all questions were answered.  Specifically the patient is aware of failure rate of 12/998, expulsion of the IUD and of possible perforation.  The patient is aware of irregular bleeding due to the method and understands the incidence of irregular bleeding diminishes with time. A copy of the signed consent is in the paper chart.   Urine pregnancy test is negative.   A Pederson speculum was placed.  The cervix was prepped using Betadine. The Mirena strings were not visible and I was unable to grasp them w/ curved kelly or pap smear wand.  Dr. Despina Hidden was called to assist. He performed a paracervical block and dilated the cervix and was able to successfully remove the Mirena.  The cervix was again prepped using Betadine.  The uterus was found to be neutral and it sounded to 8 cm.  The cervix was grasped with a tenaculum and the IUD was inserted to 8 cm.  It was pulled back 1 cm allowing the arms to release, then the IUD was forwarded to the uterine fundus and disengaged.  The strings were trimmed to 3 cm.  Sonogram was performed and the proper placement of the IUD was verified by myself and JGriffin, NP.  The patient was instructed on signs and symptoms of infection, to check for the strings after each menses or each month, and refrain from intercourse or anything in vagina for 3 days.  Rx Doxycycline 100mg  BID x 10d per [facility]  The patient is scheduled for a return appointment after her first menses or 4 weeks.  Lot#[id number]    Marge Duncans 06/04/2013 5:32 PM

## 2013-06-29 ENCOUNTER — Ambulatory Visit: Payer: Medicaid Other | Admitting: Adult Health

## 2013-07-02 ENCOUNTER — Ambulatory Visit: Payer: Medicaid Other | Admitting: Women's Health

## 2013-07-30 ENCOUNTER — Telehealth (INDEPENDENT_AMBULATORY_CARE_PROVIDER_SITE_OTHER): Payer: Self-pay | Admitting: *Deleted

## 2013-07-30 NOTE — Telephone Encounter (Signed)
Christy Wilkinson would like to speak with Dr. Karilyn Cota about her daughter Christy Wilkinson. Christy Wilkinson got up last Wednesday morning (07/25/13) vomiting up her supper from Tuesday night (07/24/13). It came up in chunks so her food is not digesting. Christy Wilkinson is also staying constipated. She has seen Dorene Ar, NP. Christy Wilkinson's return phone number is [phone].

## 2013-07-31 NOTE — Telephone Encounter (Signed)
LM for Christy Wilkinson to return the call to get a f/u apt scheduled.

## 2013-07-31 NOTE — Telephone Encounter (Signed)
Patient's mother Christy Wilkinson called. No answer. Message left. Patient is to be seen in the office. Was last seen by Ms. Setzer in September 2012

## 2013-07-31 NOTE — Telephone Encounter (Signed)
Forwarded to [facility] for an appointment. Refer to Dr.Rehman's response to message rec'd by mother.

## 2013-07-31 NOTE — Telephone Encounter (Signed)
Discussed with Dr.Rehman and forwarded to Dr.Rehman for review.

## 2013-08-01 NOTE — Telephone Encounter (Signed)
This was reviewed. Patient will contact our office for future appointment if needed.

## 2013-08-01 NOTE — Telephone Encounter (Signed)
Christy Wilkinson is trying home remedies and it seems to help. She is without insurance at this time, also a single mother and going to school full time.  If she has any further problems will call back to schedule an apt.

## 2013-10-25 ENCOUNTER — Other Ambulatory Visit: Payer: Self-pay

## 2014-08-27 ENCOUNTER — Telehealth (INDEPENDENT_AMBULATORY_CARE_PROVIDER_SITE_OTHER): Payer: Self-pay | Admitting: *Deleted

## 2014-08-27 NOTE — Telephone Encounter (Signed)
She needs to be seen by PCP or in the emergency room since she is having syncopal episodes. I will not be able to see patient this week

## 2014-08-27 NOTE — Telephone Encounter (Signed)
Patient was called and given Dr.Rehman's recommendation. She states that she took 2 Doculax last night and she had a huge BM this morning. Dr.Rehman had an opening on his schedule for 09/02/14 @ 3 pm, so we have given her that appointment. She states that she will call if there are any changes.

## 2014-08-27 NOTE — Telephone Encounter (Signed)
Patient's Mother called and states that her daughter needs to be seen. She is having constipation , has not BM in  Over 1 week and a half. Patient is passing out due to this. Unable go to work this morning. She was last seen in office 2012 by Camelia Eng , who per Mother told patient that she did not have Crohns and this could not be determined unless testing was done. Mother states that nothing was done. Patient has not come back due to being so upset from previous visit.  Patient has changed her diet , no junk foods,drinks lots of water. Eating activa. She is using Castor Oil,and enemas.  Patient would like to be seen by Dr.Rehman for an evaluation.

## 2014-08-28 ENCOUNTER — Other Ambulatory Visit (HOSPITAL_COMMUNITY)
Admission: RE | Admit: 2014-08-28 | Discharge: 2014-08-28 | Disposition: A | Discharge status: Home / Self Care | Payer: Medicaid Other | Source: Ambulatory Visit | Attending: Adult Health | Admitting: Adult Health

## 2014-08-28 ENCOUNTER — Encounter: Payer: Self-pay | Admitting: Adult Health

## 2014-08-28 ENCOUNTER — Ambulatory Visit (INDEPENDENT_AMBULATORY_CARE_PROVIDER_SITE_OTHER): Payer: Medicaid Other | Admitting: Adult Health

## 2014-08-28 VITALS — BP 126/64 | HR 80 | Ht 62.0 in | Wt 156.0 lb

## 2014-08-28 DIAGNOSIS — Z113 Encounter for screening for infections with a predominantly sexual mode of transmission: Secondary | ICD-10-CM | POA: Diagnosis present

## 2014-08-28 DIAGNOSIS — Z01419 Encounter for gynecological examination (general) (routine) without abnormal findings: Secondary | ICD-10-CM | POA: Insufficient documentation

## 2014-08-28 DIAGNOSIS — R42 Dizziness and giddiness: Secondary | ICD-10-CM

## 2014-08-28 DIAGNOSIS — Z3049 Encounter for surveillance of other contraceptives: Secondary | ICD-10-CM

## 2014-08-28 DIAGNOSIS — Z975 Presence of (intrauterine) contraceptive device: Secondary | ICD-10-CM | POA: Insufficient documentation

## 2014-08-28 HISTORY — DX: Presence of (intrauterine) contraceptive device: Z97.5

## 2014-08-28 HISTORY — DX: Dizziness and giddiness: R42

## 2014-08-28 MED ORDER — MECLIZINE HCL 50 MG PO TABS: ORAL_TABLET | ORAL | Status: DC

## 2014-08-28 NOTE — Progress Notes (Signed)
Patient ID: Christy Wilkinson, female   DOB: December 04, 1986, 28 y.o.   MRN: [id number] History of Present Illness: Christy Wilkinson is a 28 year old white female,single, in for a pap and physical and is complaining of dizziness on and of since Saturday.No congestion noted.Has IUD and has periods.   Current Medications, Allergies, Past Medical History, Past Surgical History, Family History and Social History were reviewed in [employer] record.     Review of Systems: Patient denies any headaches, blurred vision, shortness of breath, chest pain, abdominal pain, problems with urination, or intercourse. No joint pain or mood swings.she has constipation and has appt with Dr Karilyn Cota next week,see HPI for positives.    Physical Exam:BP 126/64  Pulse 80  Ht  (1.575 m)  Wt 156 lb (70.761 kg)  BMI 28.53 kg/m2  LMP 08/03/2014 General:  Well developed, well nourished, no acute distress Skin:  Warm and dry Neck:  Midline trachea, normal thyroid, no fluid in ear,TM pearly gray, CN II-XII intact,some dizziness with change of postion Lungs; Clear to auscultation bilaterally Breast:  No dominant palpable mass, retraction, or nipple discharge Cardiovascular: Regular rate and rhythm Abdomen:  Soft, non tender, no hepatosplenomegaly Pelvic:  External genitalia is normal in appearance.  The vagina is normal in appearance, with out lesions or discharge.The cervix is bulbous. +IUD strings at os, pap with GC/CHL performed. Uterus is felt to be normal size, shape, and contour.  No  adnexal masses or tenderness noted. Extremities:  No swelling or varicosities noted Psych:  No mood changes, alert and cooperative,seems happy   Impression: Yearly gyn exam family planning medicaid IUD in place  Dizziness    Plan: Check HIV and RPR Rx Antivert 50 1 bid prn dizziness #30 no refills,c an take 25 mg OTC Physical in 1 year See Dr Karilyn Cota next week

## 2014-08-28 NOTE — Patient Instructions (Signed)

## 2014-08-29 LAB — HIV ANTIBODY (ROUTINE TESTING W REFLEX): HIV 1&2 Ab, 4th Generation: NONREACTIVE

## 2014-08-29 LAB — RPR

## 2014-08-30 LAB — CYTOLOGY - PAP

## 2014-09-02 ENCOUNTER — Encounter (INDEPENDENT_AMBULATORY_CARE_PROVIDER_SITE_OTHER): Payer: Self-pay | Admitting: *Deleted

## 2014-09-02 ENCOUNTER — Ambulatory Visit (INDEPENDENT_AMBULATORY_CARE_PROVIDER_SITE_OTHER): Payer: Self-pay | Admitting: Internal Medicine

## 2014-09-02 ENCOUNTER — Encounter (INDEPENDENT_AMBULATORY_CARE_PROVIDER_SITE_OTHER): Payer: Self-pay | Admitting: Internal Medicine

## 2014-09-02 VITALS — BP 118/70 | HR 76 | Temp 99.4°F | Resp 18 | Ht 63.0 in | Wt 158.9 lb

## 2014-09-02 DIAGNOSIS — K5909 Other constipation: Secondary | ICD-10-CM

## 2014-09-02 DIAGNOSIS — R5383 Other fatigue: Secondary | ICD-10-CM

## 2014-09-02 DIAGNOSIS — R5381 Other malaise: Secondary | ICD-10-CM

## 2014-09-02 DIAGNOSIS — K5900 Constipation, unspecified: Secondary | ICD-10-CM

## 2014-09-02 MED ORDER — POLYETHYLENE GLYCOL 3350 17 GM/SCOOP PO POWD: 8.5000 g | Freq: Once | ORAL | Status: DC

## 2014-09-02 NOTE — Progress Notes (Signed)
Presenting complaint;  Followup for constipation.  History of present illness;  Patient is 28 year old Caucasian female who is here for reevaluation of chronic constipation. She was initially seen by Ms. Setzer NP in September 2012 and given Amitiza but did not return for followup visit. Patient's mother who has Crohn's disease called a few days ago and requested for her to be seen again. Patient states that she had no bowel problems until her C-sections. She had one in 2007 and second one in 2008. Ever since she's been constipated. She may go as many as 2 weeks without a bowel movement. She is eating fiber rich foods. She is also trying stool Soffer but without any benefit. She's also tried digestive health or probiotic but cannot tell any difference. She takes Dulcolax no more than once a week in general he has good results. More recently she tried wheatgrass which worked initially but not anymore. The longer she goes without a bowel movement more bloated and miserable she feels. When she is constipated she also has vertigo and lightheadedness. She was seen by Ms. Cyril Mourning NP last week and given prescription for meclizine to be used on as-needed basis. She denies melena or rectal bleeding. She has good appetite. She has gained 20 pounds this year. She also complains of feeling tired and sleepy all the time she is working full-time. She tries to walk twice a week..   Current Medications: Outpatient Encounter Prescriptions as of 09/02/2014  Medication Sig  . Bisacodyl (DULCOLAX PO) Take by mouth as needed.  Tery Sanfilippo Sodium (COLACE PO) Take by mouth as needed.  Marland Kitchen levonorgestrel (MIRENA) 20 MCG/24HR IUD 1 each by Intrauterine route once.    . meclizine (ANTIVERT) 50 MG tablet Take 1 bid  . Multiple Vitamins-Minerals (ALIVE WOMENS ENERGY PO) Take by mouth 2 (two) times daily.   Past medical history;  Chronic constipation as above. C-section in 2007 and 2008. History of vertigo. History  of genital herpes. She had ablation of dysplastic lesion involving cervix and January 2012. She has IUD in place. She had hepatitis A and B. vaccination while in school.  Allergies; Allergies  Allergen Reactions  . Zithromax [Azithromycin Dihydrate] Hives    Family history;  Father has diabetes mellitus and mother has Crohn's disease. She has 2 sisters and one brother in good health.  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 She has 2 daughters ages 32 and 53 in good health. She smokes cigarettes for 16 years but half a pack a day but quit one year ago. She drinks alcohol occasionally.         Objective: Blood pressure 118/70, pulse 76, temperature 99.4 F (37.4 C), temperature source Oral, resp. rate 18, height  (1.6 m), weight 158 lb 14.4 oz (72.077 kg), last menstrual period 08/03/2014. Patient is alert and in no acute distress. Conjunctiva is pink. Sclera is nonicteric Oropharyngeal mucosa is normal. No neck masses or thyromegaly noted. Cardiac exam with regular rhythm normal S1 and S2. No murmur or gallop noted. Lungs are clear to auscultation. Abdomen is symmetrical. Bowel sounds are normal. Abdomen is soft and nontender without organomegaly or masses.  No LE edema or clubbing noted.  Labs/studies Results: No recent CBC or comprehensive chemistry panel available. Hepatitis B surface antigen was negative and HCV antibody nonreactive on 05/21/2013.   Assessment:  #1. Chronic constipation with vasovagal symptoms. She possibly has colonic dysmotility. Will try simple measures before evaluation undertaken to rule  out colonic motility disorder. Also need to make sure she does not have hypercalcemia or hypothyroidism. #2. Fatigue. Rule out hypothyroidism.    Recommendations;  Patient will go to lab for CBC, comprehensive chemistry panel and TSH. High fiber diet. Patient advised to eat one apple and few prunes daily. Polyethylene  glycol 8.5 g by mouth daily. Dulcolax suppository every third day as needed. Patient advised to use Dulcolax tablet only as last resort. Stool diary until office visit in 8 weeks.

## 2014-09-02 NOTE — Patient Instructions (Addendum)
High fiber diet. Eat one apple and prunes daily. Stool diary as to frequency and consistency of stools and on days when he have to take a laxative pill or use suppository. Can use Dulcolax suppository every third day as needed or when you  have an urge and can't go. Physician will call with results of blood tests

## 2014-09-03 LAB — CBC
HCT: 38.9 % (ref 36.0–46.0)
Hemoglobin: 13.2 g/dL (ref 12.0–15.0)
MCH: 29.9 pg (ref 26.0–34.0)
MCHC: 33.9 g/dL (ref 30.0–36.0)
MCV: 88 fL (ref 78.0–100.0)
PLATELETS: 223 10*3/uL (ref 150–400)
RBC: 4.42 MIL/uL (ref 3.87–5.11)
RDW: 13.5 % (ref 11.5–15.5)
WBC: 9.7 10*3/uL (ref 4.0–10.5)

## 2014-09-03 LAB — COMPREHENSIVE METABOLIC PANEL
ALBUMIN: 4.5 g/dL (ref 3.5–5.2)
ALT: 13 U/L (ref 0–35)
AST: 20 U/L (ref 0–37)
Alkaline Phosphatase: 56 U/L (ref 39–117)
BUN: 7 mg/dL (ref 6–23)
CALCIUM: 9.3 mg/dL (ref 8.4–10.5)
CHLORIDE: 103 meq/L (ref 96–112)
CO2: 30 meq/L (ref 19–32)
Creat: 0.64 mg/dL (ref 0.50–1.10)
Glucose, Bld: 72 mg/dL (ref 70–99)
POTASSIUM: 3.6 meq/L (ref 3.5–5.3)
SODIUM: 140 meq/L (ref 135–145)
TOTAL PROTEIN: 6.9 g/dL (ref 6.0–8.3)
Total Bilirubin: 0.3 mg/dL (ref 0.2–1.2)

## 2014-09-03 LAB — TSH: TSH: 1.69 u[IU]/mL (ref 0.350–4.500)

## 2014-10-21 ENCOUNTER — Encounter (INDEPENDENT_AMBULATORY_CARE_PROVIDER_SITE_OTHER): Payer: Self-pay | Admitting: Internal Medicine

## 2014-11-04 ENCOUNTER — Ambulatory Visit (INDEPENDENT_AMBULATORY_CARE_PROVIDER_SITE_OTHER): Payer: Medicaid Other | Admitting: Internal Medicine

## 2015-12-10 ENCOUNTER — Ambulatory Visit: Payer: Medicaid Other | Admitting: Advanced Practice Midwife

## 2015-12-16 ENCOUNTER — Other Ambulatory Visit (HOSPITAL_COMMUNITY)
Admission: RE | Admit: 2015-12-16 | Discharge: 2015-12-16 | Disposition: A | Discharge status: Home / Self Care | Payer: BLUE CROSS/BLUE SHIELD | Source: Ambulatory Visit | Attending: Advanced Practice Midwife | Admitting: Advanced Practice Midwife

## 2015-12-16 ENCOUNTER — Ambulatory Visit (INDEPENDENT_AMBULATORY_CARE_PROVIDER_SITE_OTHER): Payer: BLUE CROSS/BLUE SHIELD | Admitting: Advanced Practice Midwife

## 2015-12-16 ENCOUNTER — Encounter: Payer: Self-pay | Admitting: Advanced Practice Midwife

## 2015-12-16 VITALS — BP 98/60 | HR 66 | Ht 63.0 in | Wt 158.4 lb

## 2015-12-16 DIAGNOSIS — Z01419 Encounter for gynecological examination (general) (routine) without abnormal findings: Secondary | ICD-10-CM

## 2015-12-16 DIAGNOSIS — Z113 Encounter for screening for infections with a predominantly sexual mode of transmission: Secondary | ICD-10-CM | POA: Insufficient documentation

## 2015-12-16 MED ORDER — MEGESTROL ACETATE 40 MG PO TABS: ORAL_TABLET | ORAL | Status: DC

## 2015-12-16 NOTE — Progress Notes (Signed)
Christy CrookLeslie A Miller 29 y.o.  Filed Vitals:   12/16/15 1149  BP: 98/60  Pulse: 66     Past Medical History: Past Medical History  Diagnosis Date  . Family history of Crohn's disease     Mother  . HSV-2 (herpes simplex virus 2) infection   . Abnormal Pap smear   . Constipation   . Vaginal Pap smear, abnormal   . Dizzy 08/28/2014  . IUD (intrauterine device) in place 08/28/2014    Past Surgical History: Past Surgical History  Procedure Laterality Date  . Gyn procedure for cervical cancer      scrapping  . Cervical cone biopsy    . Cesarean section      x 2    Family History: Family History  Problem Relation Age of Onset  . Crohn's disease Mother   . Hypertension Maternal Grandmother   . Stroke Maternal Grandmother   . Diabetes Father     borderline  . Hypertension Paternal Grandfather     Social History: Social History  Substance Use Topics  . Smoking status: Former Smoker    Types: Cigarettes    Quit date: 09/02/2013  . Smokeless tobacco: Never Used     Comment: twice a week  . Alcohol Use: Yes     Comment: occasionally    Allergies:  Allergies  Allergen Reactions  . Zithromax [Azithromycin Dihydrate] Hives      Current outpatient prescriptions:  .  Bisacodyl (DULCOLAX PO), Take by mouth as needed. Reported on 12/16/2015, Disp: , Rfl:  .  levonorgestrel (MIRENA) 20 MCG/24HR IUD, 1 each by Intrauterine route once.  , Disp: , Rfl:  .  Multiple Vitamins-Minerals (ALIVE WOMENS ENERGY PO), Take by mouth 2 (two) times daily., Disp: , Rfl:  .  polyethylene glycol powder (GLYCOLAX) powder, Take 8.5 g by mouth once., Disp: 255 g, Rfl: 0  History of Present Illness: Here for FP pap and physical   Has had her 2nd Mirena for several years, usually amenorrheic.  Has been bleeding 3 out of 4 weeks for the past several months, light to heavy.  Feels like she has PMS while bleeding.  Moody and yells at kids.    Review of Systems   Patient denies any headaches, blurred  vision, shortness of breath, chest pain, abdominal pain, problems with bowel movements, urination, or intercourse.   Physical Exam: General:  Well developed, well nourished, no acute distress Skin:  Warm and dry Neck:  Midline trachea, normal thyroid Lungs; Clear to auscultation bilaterally Breast:  No dominant palpable mass, retraction, or nipple discharge Cardiovascular: Regular rate and rhythm Abdomen:  Soft, non tender, no hepatosplenomegaly Pelvic:  External genitalia is normal in appearance.  The vagina is normal in appearance.  The cervix is bulbous.and non friable. IUD strings visible. Uterus is felt to be normal size, shape, and contour.  No adnexal masses or tenderness noted.  Extremities:  No swelling or varicosities noted Psych:  No mood changes.     Impression: DUB 2/2 IUD most likely     Plan: Megace for 3-4 weeks.  See if her mood is positivley affected by no bleeding.  Consider SSRI if not.   Pap q 3 years if this one normal

## 2015-12-18 LAB — CYTOLOGY - PAP

## 2016-05-21 ENCOUNTER — Encounter: Payer: Self-pay | Admitting: Obstetrics and Gynecology

## 2016-05-21 ENCOUNTER — Ambulatory Visit (INDEPENDENT_AMBULATORY_CARE_PROVIDER_SITE_OTHER): Payer: BLUE CROSS/BLUE SHIELD | Admitting: Obstetrics and Gynecology

## 2016-05-21 VITALS — BP 110/64 | Ht 62.0 in | Wt 155.0 lb

## 2016-05-21 DIAGNOSIS — N760 Acute vaginitis: Secondary | ICD-10-CM

## 2016-05-21 DIAGNOSIS — B9689 Other specified bacterial agents as the cause of diseases classified elsewhere: Secondary | ICD-10-CM | POA: Insufficient documentation

## 2016-05-21 DIAGNOSIS — A499 Bacterial infection, unspecified: Secondary | ICD-10-CM

## 2016-05-21 DIAGNOSIS — N898 Other specified noninflammatory disorders of vagina: Secondary | ICD-10-CM

## 2016-05-21 LAB — POCT WET PREP WITH KOH
KOH Prep POC: NEGATIVE
TRICHOMONAS UA: NEGATIVE
Yeast Wet Prep HPF POC: NEGATIVE

## 2016-05-21 MED ORDER — METRONIDAZOLE 500 MG PO TABS: 500.0000 mg | ORAL_TABLET | Freq: Two times a day (BID) | ORAL | Status: DC

## 2016-05-21 NOTE — Progress Notes (Signed)
[facility] Visit  05/21/2016            Patient name: Christy Wilkinson MRN [id number]  Date of birth: [birth year]  CC & HPI:  Christy Wilkinson is a 30 y.o. female presenting today for mild intermittent vaginal discharge with malodorous odor onset . Pt reports that she began to have these symptoms following taking baths with bath oils. Pt notes that she used tampons but hasn't used one in awhile due to IUD x 6 years. Pt states that she is sexually active with the same partner for 4 years. Pt has two children by C-section. Pt reports that she had a LEEP procedure completed 3 years ago following her C-section. Pt has not tried any medications for the relief of her symptoms. Pt denies any other symptoms.   ROS:  ROS  +Vaginal discharge +Vaginal odor  Pertinent History Reviewed:   Reviewed: Significant for HSV2 Medical         Past Medical History  Diagnosis Date  . Family history of Crohn's disease     Mother  . HSV-2 (herpes simplex virus 2) infection   . Abnormal Pap smear   . Constipation   . Vaginal Pap smear, abnormal   . Dizzy 08/28/2014  . IUD (intrauterine device) in place 08/28/2014                              Surgical Hx:    Past Surgical History  Procedure Laterality Date  . Gyn procedure for cervical cancer      scrapping  . Cervical cone biopsy    . Cesarean section      x 2   Medications: Reviewed & Updated - see associated section                       Current outpatient prescriptions:  .  docusate sodium (COLACE) 100 MG capsule, Take 100 mg by mouth 2 (two) times daily., Disp: , Rfl:  .  levonorgestrel (MIRENA) 20 MCG/24HR IUD, 1 each by Intrauterine route once.  , Disp: , Rfl:  .  Multiple Vitamins-Minerals (ALIVE WOMENS ENERGY PO), Take by mouth 2 (two) times daily., Disp: , Rfl:  .  OVER THE COUNTER MEDICATION, Take 1 tablet by mouth daily. Zantrex3., Disp: , Rfl:  .  polyethylene glycol powder (GLYCOLAX) powder, Take 8.5 g by mouth once., Disp: 255 g,  Rfl: 0 .  Probiotic Product (PROBIOTIC DAILY PO), Take by mouth daily., Disp: , Rfl:    Social History: Reviewed -  reports that she quit smoking about 2 years ago. Her smoking use included Cigarettes. She has never used smokeless tobacco.  Objective Findings:  Vitals: Blood pressure 110/64, height  (1.575 m), weight 155 lb (70.308 kg).  Physical Examination:  Pelvic - CERVIX: normal appearing cervix without discharge or lesions, cervical discharge present - moderate, white, Wilkinson odor appreciated UTERUS: anterior uterus is normal size, shape, consistency and nontender,  ADNEXA: normal adnexa in size, nontender and Wilkinson masses  WET MOUNT done - results: KOH done and negative, positive for clue cells   Assessment & Plan:   A:  1. Bacterial Vaginosis   P:  1. Metronidazole pill Rx bid x 7d Ref x 1   I personally performed the services described in this documentation, which was SCRIBED in my presence. The recorded information has been reviewed and considered accurate. It has  been edited as necessary during review. Tilda BurrowFERGUSON,Djeneba Barsch V, MD     By signing my name below, I, Soijett Blue, attest that this documentation has been prepared under the direction and in the presence of Tilda BurrowJohn V Tailyn Hantz, MD. Electronically Signed: Soijett Blue, ED Scribe. 05/21/2016. 11:00 AM.

## 2016-05-23 LAB — GC/CHLAMYDIA PROBE AMP
CHLAMYDIA, DNA PROBE: NEGATIVE
NEISSERIA GONORRHOEAE BY PCR: NEGATIVE

## 2016-05-23 LAB — PLEASE NOTE

## 2016-05-24 ENCOUNTER — Ambulatory Visit: Payer: BLUE CROSS/BLUE SHIELD | Admitting: Adult Health

## 2016-11-18 ENCOUNTER — Ambulatory Visit (INDEPENDENT_AMBULATORY_CARE_PROVIDER_SITE_OTHER): Payer: BLUE CROSS/BLUE SHIELD | Admitting: Advanced Practice Midwife

## 2016-11-18 ENCOUNTER — Encounter: Payer: Self-pay | Admitting: Advanced Practice Midwife

## 2016-11-18 VITALS — BP 108/70 | HR 68 | Wt 155.0 lb

## 2016-11-18 DIAGNOSIS — N898 Other specified noninflammatory disorders of vagina: Secondary | ICD-10-CM | POA: Diagnosis not present

## 2016-11-18 DIAGNOSIS — N760 Acute vaginitis: Secondary | ICD-10-CM

## 2016-11-18 DIAGNOSIS — B9689 Other specified bacterial agents as the cause of diseases classified elsewhere: Secondary | ICD-10-CM

## 2016-11-18 MED ORDER — METRONIDAZOLE 0.75 % VA GEL: 1.0000 | Freq: Every day | VAGINAL | 1 refills | Status: DC

## 2016-11-18 NOTE — Progress Notes (Signed)
[facility] Visit  Patient name: Christy Wilkinson MRN [id number]  Date of birth: [birth year]  CC & HPI:  Christy Wilkinson is a 30 y.o. Caucasian female presenting today for intermittent vaginal dc.  Notices fishy smell after using bath bombs and sex. Has had BV a few times since stopping probiotics  Pertinent History Reviewed:  Medical & Surgical Hx:   Past Medical History:  Diagnosis Date  . Abnormal Pap smear   . Constipation   . Dizzy 08/28/2014  . Family history of Crohn's disease    Mother  . HSV-2 (herpes simplex virus 2) infection   . IUD (intrauterine device) in place 08/28/2014  . Vaginal Pap smear, abnormal    Past Surgical History:  Procedure Laterality Date  . CERVICAL CONE BIOPSY    . CESAREAN SECTION     x 2  . GYN procedure for cervical cancer     scrapping   Family History  Problem Relation Age of Onset  . Crohn's disease Mother   . Hypertension Maternal Grandmother   . Stroke Maternal Grandmother   . Diabetes Father     borderline  . Hypertension Paternal Grandfather     Current Outpatient Prescriptions:  .  levonorgestrel (MIRENA) 20 MCG/24HR IUD, 1 each by Intrauterine route once.  , Disp: , Rfl:  .  Multiple Vitamins-Minerals (ALIVE WOMENS ENERGY PO), Take by mouth 2 (two) times daily., Disp: , Rfl:  .  OVER THE COUNTER MEDICATION, Take 1 tablet by mouth daily. Zantrex3., Disp: , Rfl:  .  docusate sodium (COLACE) 100 MG capsule, Take 100 mg by mouth 2 (two) times daily., Disp: , Rfl:  .  metroNIDAZOLE (METROGEL VAGINAL) 0.75 % vaginal gel, Place 1 Applicatorful vaginally at bedtime., Disp: 70 g, Rfl: 1 .  Probiotic Product (PROBIOTIC DAILY PO), Take by mouth daily., Disp: , Rfl:  Social History: Reviewed -  reports that she quit smoking about 3 years ago. Her smoking use included Cigarettes. She has never used smokeless tobacco.  Review of Systems:   Constitutional: Negative for fever and chills Eyes: Negative for visual  disturbances Respiratory: Negative for shortness of breath, dyspnea Cardiovascular: Negative for chest pain or palpitations  Gastrointestinal: Negative for vomiting, diarrhea and constipation; no abdominal pain Genitourinary: Negative for dysuria and urgency, vaginal irritation or itching Musculoskeletal: Negative for back pain, joint pain, myalgias  Neurological: Negative for dizziness and headaches    Objective Findings:    Physical Examination: General appearance - well appearing, and in no distress Mental status - alert, oriented to person, place, and time Chest:  Normal respiratory effort Heart - normal rate and regular rhythm Abdomen:  Soft, nontender Pelvic: SSE:  Thin white DC with fishy odor.  Wet Prep + clue only Musculoskeletal:  Normal range of motion without pain Extremities:  No edema    No results found for this or any previous visit (from the past 24 hour(s)).    Assessment & Plan:  A:   BV P:  Metrogel.  May want to start back probiotice   Return if symptoms worsen or fail to improve.  CRESENZO-DISHMAN,Marithza Malachi CNM 11/18/2016 12:17 PM

## 2016-11-18 NOTE — Patient Instructions (Signed)
Bacterial Vaginosis Bacterial vaginosis is a vaginal infection that occurs when the normal balance of bacteria in the vagina is disrupted. It results from an overgrowth of certain bacteria. This is the most common vaginal infection among women ages 15-44. Because bacterial vaginosis increases your risk for STIs (sexually transmitted infections), getting treated can help reduce your risk for chlamydia, gonorrhea, herpes, and HIV (human immunodeficiency virus). Treatment is also important for preventing complications in pregnant women, because this condition can cause an early (premature) delivery. What are the causes? This condition is caused by an increase in harmful bacteria that are normally present in small amounts in the vagina. However, the reason that the condition develops is not fully understood. What increases the risk? The following factors may make you more likely to develop this condition:  Having a new sexual partner or multiple sexual partners.  Having unprotected sex.  Douching.  Having an intrauterine device (IUD).  Smoking.  Drug and alcohol abuse.  Taking certain antibiotic medicines.  Being pregnant.  You cannot get bacterial vaginosis from toilet seats, bedding, swimming pools, or contact with objects around you. What are the signs or symptoms? Symptoms of this condition include:  Grey or white vaginal discharge. The discharge can also be watery or foamy.  A fish-like odor with discharge, especially after sexual intercourse or during menstruation.  Itching in and around the vagina.  Burning or pain with urination.  Some women with bacterial vaginosis have no signs or symptoms. How is this diagnosed? This condition is diagnosed based on:  Your medical history.  A physical exam of the vagina.  Testing a sample of vaginal fluid under a microscope to look for a large amount of bad bacteria or abnormal cells. Your health care provider may use a cotton swab  or a small wooden spatula to collect the sample.  How is this treated? This condition is treated with antibiotics. These may be given as a pill, a vaginal cream, or a medicine that is put into the vagina (suppository). If the condition comes back after treatment, a second round of antibiotics may be needed. Follow these instructions at home: Medicines  Take over-the-counter and prescription medicines only as told by your health care provider.  Take or use your antibiotic as told by your health care provider. Do not stop taking or using the antibiotic even if you start to feel better. General instructions  If you have a female sexual partner, tell her that you have a vaginal infection. She should see her health care provider and be treated if she has symptoms. If you have a female sexual partner, he does not need treatment.  During treatment: ? Avoid sexual activity until you finish treatment. ? Do not douche. ? Avoid alcohol as directed by your health care provider. ? Avoid breastfeeding as directed by your health care provider.  Drink enough water and fluids to keep your urine clear or pale yellow.  Keep the area around your vagina and rectum clean. ? Wash the area daily with warm water. ? Wipe yourself from front to back after using the toilet.  Keep all follow-up visits as told by your health care provider. This is important. How is this prevented?  Do not douche.  Wash the outside of your vagina with warm water only.  Use protection when having sex. This includes latex condoms and dental dams.  Limit how many sexual partners you have. To help prevent bacterial vaginosis, it is best to have sex with just   one partner (monogamous).  Make sure you and your sexual partner are tested for STIs.  Wear cotton or cotton-lined underwear.  Avoid wearing tight pants and pantyhose, especially during summer.  Limit the amount of alcohol that you drink.  Do not use any products that  contain nicotine or tobacco, such as cigarettes and e-cigarettes. If you need help quitting, ask your health care provider.  Do not use illegal drugs. Where to find more information:  Centers for Disease Control and Prevention: www.cdc.gov/std  American Sexual Health Association (ASHA): www.ashastd.org  U.S. Department of Health and Human Services, Office on Women's Health: www.womenshealth.gov/ or https://www.womenshealth.gov/a-z-topics/bacterial-vaginosis Contact a health care provider if:  Your symptoms do not improve, even after treatment.  You have more discharge or pain when urinating.  You have a fever.  You have pain in your abdomen.  You have pain during sex.  You have vaginal bleeding between periods. Summary  Bacterial vaginosis is a vaginal infection that occurs when the normal balance of bacteria in the vagina is disrupted.  Because bacterial vaginosis increases your risk for STIs (sexually transmitted infections), getting treated can help reduce your risk for chlamydia, gonorrhea, herpes, and HIV (human immunodeficiency virus). Treatment is also important for preventing complications in pregnant women, because the condition can cause an early (premature) delivery.  This condition is treated with antibiotic medicines. These may be given as a pill, a vaginal cream, or a medicine that is put into the vagina (suppository). This information is not intended to replace advice given to you by your health care provider. Make sure you discuss any questions you have with your health care provider. Document Released: 12/06/2005 Document Revised: 08/21/2016 Document Reviewed: 08/21/2016 Elsevier Interactive Patient Education  2017 [employer] Inc.  

## 2016-12-16 ENCOUNTER — Other Ambulatory Visit: Payer: BLUE CROSS/BLUE SHIELD | Admitting: Advanced Practice Midwife

## 2016-12-29 ENCOUNTER — Other Ambulatory Visit: Payer: BLUE CROSS/BLUE SHIELD | Admitting: Adult Health

## 2017-01-12 ENCOUNTER — Other Ambulatory Visit (HOSPITAL_COMMUNITY)
Admission: RE | Admit: 2017-01-12 | Discharge: 2017-01-12 | Disposition: A | Discharge status: Home / Self Care | Payer: BLUE CROSS/BLUE SHIELD | Source: Ambulatory Visit | Attending: Adult Health | Admitting: Adult Health

## 2017-01-12 ENCOUNTER — Encounter: Payer: Self-pay | Admitting: Adult Health

## 2017-01-12 ENCOUNTER — Ambulatory Visit (INDEPENDENT_AMBULATORY_CARE_PROVIDER_SITE_OTHER): Payer: BLUE CROSS/BLUE SHIELD | Admitting: Adult Health

## 2017-01-12 VITALS — BP 121/71 | HR 81 | Ht 62.0 in | Wt 154.5 lb

## 2017-01-12 DIAGNOSIS — Z113 Encounter for screening for infections with a predominantly sexual mode of transmission: Secondary | ICD-10-CM | POA: Diagnosis present

## 2017-01-12 DIAGNOSIS — Z975 Presence of (intrauterine) contraceptive device: Secondary | ICD-10-CM

## 2017-01-12 DIAGNOSIS — Z01419 Encounter for gynecological examination (general) (routine) without abnormal findings: Secondary | ICD-10-CM

## 2017-01-12 DIAGNOSIS — Z1151 Encounter for screening for human papillomavirus (HPV): Secondary | ICD-10-CM | POA: Diagnosis not present

## 2017-01-12 DIAGNOSIS — Z3009 Encounter for other general counseling and advice on contraception: Secondary | ICD-10-CM

## 2017-01-12 DIAGNOSIS — K5900 Constipation, unspecified: Secondary | ICD-10-CM

## 2017-01-12 MED ORDER — LINACLOTIDE 145 MCG PO CAPS: 145.0000 ug | ORAL_CAPSULE | Freq: Every day | ORAL | 6 refills | Status: DC

## 2017-01-12 NOTE — Progress Notes (Signed)
Patient ID: Christy Wilkinson, female   DOB: 11-Sep-1986, 31 y.o.   MRN: [id number] History of Present Illness:  Christy Wilkinson is a 31 year old white female, engaged, getting married in April, in for well woman gyn exam and pap.Has [place] medicaid and BC[employer].She is complaining of constipation, has seen Dr Karilyn Cotaehman and tried [place]am[place]iza without relief, may have BM every 10 days. She has increased fiber and water and walking too, with no results.   Current Medications, Allergies, Past Medical History, Past Surgical History, Family History and Social History were reviewed in [facility] electronic medical record.     Review of Systems: Patient denies any headaches, hearing loss, fatigue, blurred vision, shortness of breath, chest pain, abdominal pain, problems with urination, or intercourse. No joint pain or mood swings. +constipation   Physical Exam:BP 121/71 (BP Location: Left Arm, Patient Position: Sitting, Cuff Size: Normal)   Pulse 81   Ht 5\' 2"  (1.575 m)   Wt 154 lb 8 oz (70.1 kg)   BMI 28.26 kg/m  General:  Well developed, well nourished, no acute distress Skin:  Warm and dry Neck:  Midline trachea, normal thyroid, good ROM, no lymphadenopathy Lungs; Clear to auscultation bilaterally Breast:  No dominant palpable mass, retraction, or nipple discharge Cardiovascular: Regular rate and rhythm Abdomen:  Soft, non tender, no hepatosplenomegaly Pelvic:  External genitalia is normal in appearance, no lesions.  The vagina is normal in appearance. Urethra has no lesions or masses. The cervix is bulbous, +IUD strings, pap with HPV and GC/CHL performed.  Uterus is felt to be normal size, shape, and contour.  No adnexal masses or tenderness noted.Bladder is non tender, no masses felt.  Extremities/musculoskeletal:  No swelling or varicosities noted, no clubbing or cyanosis Psych:  No mood changes, alert and cooperative,seems happy PHQ 2 score 0  Impression: 1. Encounter for gynecological examination with  Papanicolaou smear of cervix   2. Family planning   3. Constipation, unspecified constipation type   4. IUD (intrauterine device) in place       Plan: Meds ordered this encounter  Medications  . DISCONTD: UNABLE TO FIND    Sig: Digestive advantage-1 daily  . linaclotide (LINZESS) 145 MCG CAPS capsule    Sig: Take 1 capsule (145 mcg total) by mouth daily before breakfast.    Dispense:  30 capsule    Refill:  6    Order Specific Question:   Supervising Provider    Answer:   Christy Wilkinson, Christy Wilkinson [[id number]]  Check HIV and RPR Physical in 1 year Call when wants IUD removed

## 2017-01-13 LAB — HIV ANTIBODY (ROUTINE TESTING W REFLEX): HIV Screen 4th Generation wRfx: NONREACTIVE

## 2017-01-13 LAB — CYTOLOGY - PAP
ADEQUACY: ABSENT
Chlamydia: NEGATIVE
DIAGNOSIS: NEGATIVE
HPV: NOT DETECTED
Neisseria Gonorrhea: NEGATIVE

## 2017-01-13 LAB — RPR: RPR: NONREACTIVE

## 2017-03-14 ENCOUNTER — Ambulatory Visit: Payer: BLUE CROSS/BLUE SHIELD | Admitting: Obstetrics & Gynecology

## 2017-04-18 ENCOUNTER — Telehealth: Payer: Self-pay | Admitting: Advanced Practice Midwife

## 2017-04-19 ENCOUNTER — Other Ambulatory Visit: Payer: Self-pay | Admitting: Advanced Practice Midwife

## 2017-04-19 ENCOUNTER — Encounter: Payer: Self-pay | Admitting: Advanced Practice Midwife

## 2017-04-19 MED ORDER — METRONIDAZOLE 0.75 % VA GEL: 1.0000 | Freq: Every day | VAGINAL | 1 refills | Status: DC

## 2017-04-19 NOTE — Progress Notes (Signed)
metrogel for BV

## 2017-05-23 ENCOUNTER — Ambulatory Visit: Payer: BLUE CROSS/BLUE SHIELD | Admitting: Obstetrics & Gynecology

## 2017-06-06 ENCOUNTER — Ambulatory Visit (INDEPENDENT_AMBULATORY_CARE_PROVIDER_SITE_OTHER): Payer: Medicaid Other | Admitting: Obstetrics & Gynecology

## 2017-06-06 ENCOUNTER — Encounter: Payer: Self-pay | Admitting: Obstetrics & Gynecology

## 2017-06-06 VITALS — BP 78/40 | HR 78 | Wt 154.0 lb

## 2017-06-06 DIAGNOSIS — Z30432 Encounter for removal of intrauterine contraceptive device: Secondary | ICD-10-CM

## 2017-06-06 NOTE — Progress Notes (Signed)
Chief Complaint  Patient presents with  . IUD Removal    Blood pressure (!) 78/40, pulse 78, weight 154 lb (69.9 kg), last menstrual period 05/04/2017.  31 y.o. [id number] Patient's last menstrual period was 05/04/2017. The current method of family planning is IUD.  Outpatient Encounter Prescriptions as of 06/06/2017  Medication Sig  . levonorgestrel (MIRENA) 20 MCG/24HR IUD 1 each by Intrauterine route once.    . Multiple Vitamins-Minerals (ALIVE WOMENS ENERGY PO) Take by mouth 2 (two) times daily.  . Probiotic Product (PROBIOTIC DAILY PO) Take by mouth daily.  . [DISCONTINUED] linaclotide (LINZESS) 145 MCG CAPS capsule Take 1 capsule (145 mcg total) by mouth daily before breakfast.  . [DISCONTINUED] metroNIDAZOLE (METROGEL VAGINAL) 0.75 % vaginal gel Place 1 Applicatorful vaginally at bedtime.   No facility-administered encounter medications on file as of 06/06/2017.     Subjective Patient is here today for IUD removal She's had this one a couple years but this is her second one so total of 7 years Her bleeding pattern was unpredictable but light minimal cramping She is recently married and wants to maybe possibly trying to get pregnant  Objective IUD strings are visible through the cervical os A straight [place] issues and removed one pass that difficulty with minimal cramping There is no bleeding at the time of removal  Pertinent ROS No burning with urination, frequency or urgency No nausea, vomiting or diarrhea Nor fever chills or other constitutional symptoms   Labs or studies none    Impression Diagnoses this Encounter::   ICD-10-CM   1. Encounter for IUD removal Z30.432     Established relevant diagnosis(es):   Plan/Recommendations: No orders of the defined types were placed in this encounter.   Labs or Scans Ordered: No orders of the defined types were placed in this encounter.   Management:: Status post IUD removal today without  complication She is encouraged to begin prenatal vitamins with 1 mg of folic acid prenatally We will see her back when she returns pregnant or as needed If her periods are tumesced up she will certainly see my chart message and we will touch base at that point  Follow up Return if symptoms worsen or fail to improve.         All questions were answered.  Past Medical History:  Diagnosis Date  . Abnormal Pap smear   . Constipation   . Dizzy 08/28/2014  . Family history of Crohn's disease    Mother  . HSV-2 (herpes simplex virus 2) infection   . IUD (intrauterine device) in place 08/28/2014  . Vaginal Pap smear, abnormal     Past Surgical History:  Procedure Laterality Date  . CERVICAL CONE BIOPSY    . CESAREAN SECTION     x 2  . GYN procedure for cervical cancer     scrapping    OB History    Gravida Para Term Preterm AB Living   3 2 2   1 2    SAB TAB Ectopic Multiple Live Births   1       2      Allergies  Allergen Reactions  . Zithromax [Azithromycin Dihydrate] Hives    Social History   Social History  . Marital status: Single    Spouse name: N/A  . Number of children: N/A  . Years of education: N/A   Social History Main Topics  . Smoking status: Former Smoker    Types: Cigarettes  Quit date: 09/02/2013  . Smokeless tobacco: Never Used  . Alcohol use Yes     Comment: occasionally  . Drug use: No  . Sexual activity: Yes    Birth control/ protection: IUD   Other Topics Concern  . None   Social History Narrative  . None    Family History  Problem Relation Age of Onset  . Crohn's disease Mother   . Hypertension Maternal Grandmother   . Stroke Maternal Grandmother   . Diabetes Father        borderline  . Hypertension Paternal Grandfather

## 2017-07-08 ENCOUNTER — Ambulatory Visit: Payer: BLUE CROSS/BLUE SHIELD | Admitting: Obstetrics & Gynecology

## 2018-02-20 ENCOUNTER — Encounter: Payer: Self-pay | Admitting: Adult Health

## 2018-02-20 ENCOUNTER — Ambulatory Visit (INDEPENDENT_AMBULATORY_CARE_PROVIDER_SITE_OTHER): Payer: 59 | Admitting: Adult Health

## 2018-02-20 VITALS — BP 100/70 | HR 77 | Ht 62.25 in | Wt 167.0 lb

## 2018-02-20 DIAGNOSIS — Z113 Encounter for screening for infections with a predominantly sexual mode of transmission: Secondary | ICD-10-CM

## 2018-02-20 DIAGNOSIS — Z01419 Encounter for gynecological examination (general) (routine) without abnormal findings: Secondary | ICD-10-CM

## 2018-02-20 DIAGNOSIS — N898 Other specified noninflammatory disorders of vagina: Secondary | ICD-10-CM | POA: Insufficient documentation

## 2018-02-20 DIAGNOSIS — Z3009 Encounter for other general counseling and advice on contraception: Secondary | ICD-10-CM

## 2018-02-20 DIAGNOSIS — Z01411 Encounter for gynecological examination (general) (routine) with abnormal findings: Secondary | ICD-10-CM | POA: Diagnosis not present

## 2018-02-20 DIAGNOSIS — Z3202 Encounter for pregnancy test, result negative: Secondary | ICD-10-CM | POA: Diagnosis not present

## 2018-02-20 LAB — POCT URINE PREGNANCY: PREG TEST UR: NEGATIVE

## 2018-02-20 NOTE — Progress Notes (Signed)
Patient ID: Christy Wilkinson, female   DOB: 06/13/1986, 32 y.o.   MRN: [id number] History of Present Illness: Verlon AuLeslie is a 32 year old white female, married in for well woman gyn exam,she had a normal pap with negative HPV 01/12/17.   Current Medications, Allergies, Past Medical History, Past Surgical History, Family History and Social History were reviewed in [facility] electronic medical record.     Review of Systems:  Patient denies any headaches, hearing loss, fatigue, blurred vision, shortness of breath, chest pain, abdominal pain, problems with bowel movements, urination, or intercourse. No joint pain or mood swings. Has had vaginal discharge with odor, no itching or burning,has had BV before.  She is not using birth control and is OK if gets pregnant.    Physical Exam:BP 100/70 (BP Location: Left Arm, Patient Position: Sitting, Cuff Size: Normal)   Pulse 77   Ht 5' 2.25" (1.581 m)   Wt 167 lb (75.8 kg)   LMP 02/14/2018   BMI 30.30 kg/m UPT negative. General:  Well developed, well nourished, no acute distress Skin:  Warm and dry Neck:  Midline trachea, normal thyroid, good ROM, no lymphadenopathy Lungs; Clear to auscultation bilaterally Breast:  No dominant palpable mass, retraction, or nipple discharge Cardiovascular: Regular rate and rhythm Abdomen:  Soft, non tender, no hepatosplenomegaly Pelvic:  External genitalia is normal in appearance, no lesions.  The vagina is normal in appearance. Urethra has no lesions or masses. The cervix is bulbous. Period like blood, GC/CHL obtained. Uterus is felt to be normal size, shape, and contour.  No adnexal masses or tenderness noted.Bladder is non tender, no masses felt. Extremities/musculoskeletal:  No swelling or varicosities noted, no clubbing or cyanosis Psych:  No mood changes, alert and cooperative,seems happy PHQ 2 score 0.   Impression: 1. Encounter for well woman exam with routine gynecological exam   2. Family planning    3. Screening examination for STD (sexually transmitted disease)       Plan: GC/CHL sent Check HIV and RPR Physical in 1 year Pap in 2021  Call if discharge with odor returns, can try Metrogel

## 2018-02-21 ENCOUNTER — Telehealth: Payer: Self-pay | Admitting: Adult Health

## 2018-02-21 LAB — RPR: RPR Ser Ql: NONREACTIVE

## 2018-02-21 LAB — HIV ANTIBODY (ROUTINE TESTING W REFLEX): HIV Screen 4th Generation wRfx: NONREACTIVE

## 2018-02-21 MED ORDER — METRONIDAZOLE 0.75 % VA GEL: 1.0000 | Freq: Every day | VAGINAL | 0 refills | Status: DC

## 2018-02-21 NOTE — Telephone Encounter (Signed)
Spoke with pt. Pt now notices an odor and was told something could be called in. Pt would like med called in. Thanks!! JSY

## 2018-02-21 NOTE — Telephone Encounter (Signed)
Patient called stating that she seen Margaretha SheffieldJennifer Yesterday and was told if she had an odor that Victorino DikeJennifer would call her in something. Pt states that she does smell the odor and would like something called in.

## 2018-02-21 NOTE — Telephone Encounter (Signed)
Pt requests meds, for vaginal odor, will rx metrogel

## 2018-02-22 LAB — GC/CHLAMYDIA PROBE AMP
Chlamydia trachomatis, NAA: NEGATIVE
Neisseria gonorrhoeae by PCR: NEGATIVE

## 2018-02-23 ENCOUNTER — Telehealth: Payer: Self-pay | Admitting: *Deleted

## 2018-02-23 MED ORDER — METRONIDAZOLE 500 MG PO TABS: 500.0000 mg | ORAL_TABLET | Freq: Two times a day (BID) | ORAL | 1 refills | Status: DC

## 2018-02-23 NOTE — Telephone Encounter (Signed)
Pt aware Rx for flagyl sent

## 2018-04-13 ENCOUNTER — Ambulatory Visit (INDEPENDENT_AMBULATORY_CARE_PROVIDER_SITE_OTHER): Payer: 59 | Admitting: Adult Health

## 2018-04-13 ENCOUNTER — Encounter: Payer: Self-pay | Admitting: Adult Health

## 2018-04-13 VITALS — BP 106/62 | HR 82 | Ht 62.25 in | Wt 168.0 lb

## 2018-04-13 DIAGNOSIS — Z30014 Encounter for initial prescription of intrauterine contraceptive device: Secondary | ICD-10-CM | POA: Diagnosis not present

## 2018-04-13 NOTE — Progress Notes (Signed)
Subjective:     Patient ID: Abbey ChattersLeslie A Langbehn, female   DOB: 04/02/1986, 32 y.o.   MRN: [id number]  HPI Verlon AuLeslie is a 32 year old white female in to discuss getting IUD.  Review of Systems Patient denies any headaches, hearing loss, fatigue, blurred vision, shortness of breath, chest pain, abdominal pain, problems with bowel movements, urination, or intercourse. No joint pain or mood swings. Reviewed past medical,surgical, social and family history. Reviewed medications and allergies.     Objective:   Physical Exam BP 106/62 (BP Location: Right Arm, Patient Position: Sitting, Cuff Size: Normal)   Pulse 82   Ht 5' 2.25" (1.581 m)   Wt 168 lb (76.2 kg)   LMP 03/14/2018 (Exact Date)   BMI 30.48 kg/m  Skin warm and dry.  Lungs: clear to ausculation bilaterally. Cardiovascular: regular rate and rhythm.   Discussed IUD, and she has had 2 in past, will schedule with next period, which should be next week. Call and reschedule if not on period.   Assessment:     1. Encounter for initial prescription of intrauterine contraceptive device (IUD)       Plan:     Return in 1 week on period for IUD insertion

## 2018-04-21 ENCOUNTER — Ambulatory Visit (INDEPENDENT_AMBULATORY_CARE_PROVIDER_SITE_OTHER): Payer: 59 | Admitting: Women's Health

## 2018-04-21 ENCOUNTER — Other Ambulatory Visit: Payer: Self-pay

## 2018-04-21 ENCOUNTER — Encounter: Payer: Self-pay | Admitting: Women's Health

## 2018-04-21 VITALS — BP 108/64 | HR 80 | Ht 62.0 in | Wt 167.0 lb

## 2018-04-21 DIAGNOSIS — Z30430 Encounter for insertion of intrauterine contraceptive device: Secondary | ICD-10-CM | POA: Insufficient documentation

## 2018-04-21 DIAGNOSIS — Z3202 Encounter for pregnancy test, result negative: Secondary | ICD-10-CM

## 2018-04-21 LAB — POCT URINE PREGNANCY: PREG TEST UR: NEGATIVE

## 2018-04-21 MED ORDER — LEVONORGESTREL 19.5 MCG/DAY IU IUD
INTRAUTERINE_SYSTEM | Freq: Once | INTRAUTERINE | Status: AC
  Administered 2018-04-21: 10:00:00 via INTRAUTERINE

## 2018-04-21 NOTE — Patient Instructions (Signed)
 Nothing in vagina for 3 days (no sex, douching, tampons, etc...)  Check your strings once a month to make sure you can feel them, if you are not able to please let us know  If you develop a fever of 100.4 or more in the next few weeks, or if you develop severe abdominal pain, please let us know  Use a backup method of birth control, such as condoms, for 2 weeks   Levonorgestrel intrauterine device (IUD) What is this medicine? LEVONORGESTREL IUD (LEE voe nor jes trel) is a contraceptive (birth control) device. The device is placed inside the uterus by a healthcare professional. It is used to prevent pregnancy. This device can also be used to treat heavy bleeding that occurs during your period. This medicine may be used for other purposes; ask your health care provider or pharmacist if you have questions. COMMON BRAND NAME(S): Kyleena, LILETTA, Mirena, Skyla What should I tell my health care provider before I take this medicine? They need to know if you have any of these conditions: -abnormal Pap smear -cancer of the breast, uterus, or cervix -diabetes -endometritis -genital or pelvic infection now or in the past -have more than one sexual partner or your partner has more than one partner -heart disease -history of an ectopic or tubal pregnancy -immune system problems -IUD in place -liver disease or tumor -problems with blood clots or take blood-thinners -seizures -use intravenous drugs -uterus of unusual shape -vaginal bleeding that has not been explained -an unusual or allergic reaction to levonorgestrel, other hormones, silicone, or polyethylene, medicines, foods, dyes, or preservatives -pregnant or trying to get pregnant -breast-feeding How should I use this medicine? This device is placed inside the uterus by a health care professional. Talk to your pediatrician regarding the use of this medicine in children. Special care may be needed. Overdosage: If you think you have  taken too much of this medicine contact a poison control center or emergency room at once. NOTE: This medicine is only for you. Do not share this medicine with others. What if I miss a dose? This does not apply. Depending on the brand of device you have inserted, the device will need to be replaced every 3 to 5 years if you wish to continue using this type of birth control. What may interact with this medicine? Do not take this medicine with any of the following medications: -amprenavir -bosentan -fosamprenavir This medicine may also interact with the following medications: -aprepitant -armodafinil -barbiturate medicines for inducing sleep or treating seizures -bexarotene -boceprevir -griseofulvin -medicines to treat seizures like carbamazepine, ethotoin, felbamate, oxcarbazepine, phenytoin, topiramate -modafinil -pioglitazone -rifabutin -rifampin -rifapentine -some medicines to treat HIV infection like atazanavir, efavirenz, indinavir, lopinavir, nelfinavir, tipranavir, ritonavir -St. John's wort -warfarin This list may not describe all possible interactions. Give your health care provider a list of all the medicines, herbs, non-prescription drugs, or dietary supplements you use. Also tell them if you smoke, drink alcohol, or use illegal drugs. Some items may interact with your medicine. What should I watch for while using this medicine? Visit your doctor or health care professional for regular check ups. See your doctor if you or your partner has sexual contact with others, becomes HIV positive, or gets a sexual transmitted disease. This product does not protect you against HIV infection (AIDS) or other sexually transmitted diseases. You can check the placement of the IUD yourself by reaching up to the top of your vagina with clean fingers to feel the threads. Do   not pull on the threads. It is a good habit to check placement after each menstrual period. Call your doctor right away if  you feel more of the IUD than just the threads or if you cannot feel the threads at all. The IUD may come out by itself. You may become pregnant if the device comes out. If you notice that the IUD has come out use a backup birth control method like condoms and call your health care provider. Using tampons will not change the position of the IUD and are okay to use during your period. This IUD can be safely scanned with magnetic resonance imaging (MRI) only under specific conditions. Before you have an MRI, tell your healthcare provider that you have an IUD in place, and which type of IUD you have in place. What side effects may I notice from receiving this medicine? Side effects that you should report to your doctor or health care professional as soon as possible: -allergic reactions like skin rash, itching or hives, swelling of the face, lips, or tongue -fever, flu-like symptoms -genital sores -high blood pressure -no menstrual period for 6 weeks during use -pain, swelling, warmth in the leg -pelvic pain or tenderness -severe or sudden headache -signs of pregnancy -stomach cramping -sudden shortness of breath -trouble with balance, talking, or walking -unusual vaginal bleeding, discharge -yellowing of the eyes or skin Side effects that usually do not require medical attention (report to your doctor or health care professional if they continue or are bothersome): -acne -breast pain -change in sex drive or performance -changes in weight -cramping, dizziness, or faintness while the device is being inserted -headache -irregular menstrual bleeding within first 3 to 6 months of use -nausea This list may not describe all possible side effects. Call your doctor for medical advice about side effects. You may report side effects to FDA at 1-800-FDA-1088. Where should I keep my medicine? This does not apply. NOTE: This sheet is a summary. It may not cover all possible information. If you have  questions about this medicine, talk to your doctor, pharmacist, or health care provider.  2018 Elsevier/Gold Standard (2016-09-17 14:14:56)  

## 2018-04-21 NOTE — Addendum Note (Signed)
Addended by: Tish Frederickson A on: 04/21/2018 10:07 AM   Modules accepted: Orders

## 2018-04-21 NOTE — Progress Notes (Signed)
   IUD INSERTION Patient name: Christy Wilkinson MRN [id number]  Date of birth: 1986/08/18 Subjective Findings:   Christy Wilkinson is a 32 y.o. G15P2012 Caucasian female being seen today for insertion of a Liletta IUD.   Patient's last menstrual period was 04/17/2018. Last sexual intercourse was ~3-4wks ago Last pap 01/12/17. Results were:  normal  The risks and benefits of the method and placement have been thouroughly reviewed with the patient and all questions were answered.  Specifically the patient is aware of failure rate of 12/998, expulsion of the IUD and of possible perforation.  The patient is aware of irregular bleeding due to the method and understands the incidence of irregular bleeding diminishes with time.  Signed copy of informed consent in chart.  Pertinent History Reviewed:   Reviewed past medical,surgical, social, obstetrical and family history.  Reviewed problem list, medications and allergies. Objective Findings & Procedure:   Vitals:   04/21/18 0846  BP: 108/64  Pulse: 80  Weight: 167 lb (75.8 kg)  Height:  (1.575 m)  Body mass index is 30.54 kg/m.  Results for orders placed or performed in visit on 04/21/18 (from the past 24 hour(s))  POCT urine pregnancy   Collection Time: 04/21/18  8:49 AM  Result Value Ref Range   Preg Test, Ur Negative Negative     Time out was performed.  A graves speculum was placed in the vagina.  The cervix was visualized, prepped using Betadine, and grasped with a single tooth tenaculum. The uterus was found to be neutral and it sounded to 8 cm.  Liletta IUD placed per manufacturer's recommendations. The strings were trimmed to approximately 3 cm. The patient tolerated the procedure well.   Informal transvaginal sonogram was performed and the proper placement of the IUD was verified. Assessment & Plan:   1) Liletta IUD insertion The patient was given post procedure instructions, including signs and symptoms of infection and to check  for the strings after each menses or each month, and refraining from intercourse or anything in the vagina for 3 days. She was given a Liletta care card with date IUD placed, and date IUD to be removed. She is scheduled for a f/u appointment in 4 weeks.  Orders Placed This Encounter  Procedures  . POCT urine pregnancy    Return in about 1 month (around 05/19/2018) for F/U.  Cheral Marker CNM, [facility] 04/21/2018 9:14 AM

## 2018-05-19 ENCOUNTER — Ambulatory Visit: Payer: 59 | Admitting: Women's Health

## 2018-05-25 ENCOUNTER — Encounter: Payer: Self-pay | Admitting: Women's Health

## 2018-05-25 ENCOUNTER — Ambulatory Visit (INDEPENDENT_AMBULATORY_CARE_PROVIDER_SITE_OTHER): Payer: 59 | Admitting: Women's Health

## 2018-05-25 VITALS — BP 90/60 | HR 96 | Ht 63.0 in | Wt 166.0 lb

## 2018-05-25 DIAGNOSIS — Z30431 Encounter for routine checking of intrauterine contraceptive device: Secondary | ICD-10-CM

## 2018-05-25 NOTE — Progress Notes (Signed)
   GYN VISIT Patient name: Christy ChattersLeslie A Demirjian MRN [id number]  Date of birth: 25-Aug-1986 Chief Complaint:   Iud check  History of Present Illness:   Christy ChattersLeslie A Hires is a 32 y.o. 343P2012 Caucasian female being seen today for IUD check. Liletta inserted 04/21/18.     Patient's last menstrual period was 05/18/2018. The current method of family planning is IUD. Last pap 01/12/17. Results were:  normal Review of Systems:   Pertinent items are noted in HPI Denies fever/chills, dizziness, headaches, visual disturbances, fatigue, shortness of breath, chest pain, abdominal pain, vomiting, abnormal vaginal discharge/itching/odor/irritation, problems with periods, bowel movements, urination, or intercourse unless otherwise stated above.  Pertinent History Reviewed:  Reviewed past medical,surgical, social, obstetrical and family history.  Reviewed problem list, medications and allergies. Physical Assessment:   Vitals:   05/25/18 0952  BP: 90/60  Pulse: 96  Weight: 166 lb (75.3 kg)  Height: 5\' 3"  (1.6 m)  Body mass index is 29.41 kg/m.       Physical Examination:   General appearance: alert, well appearing, and in no distress  Mental status: alert, oriented to person, place, and time  Skin: warm & dry   Cardiovascular: normal heart rate noted  Respiratory: normal respiratory effort, no distress  Abdomen: soft, non-tender   Pelvic: VULVA: normal appearing vulva with no masses, tenderness or lesions, VAGINA: normal appearing vagina with normal color and discharge, no lesions, CERVIX: normal appearing cervix without discharge or lesions, IUD strings visible  Extremities: no edema   No results found for this or any previous visit (from the past 24 hour(s)).  Assessment & Plan:  1) IUD check> Liletta IUD in place  Meds: No orders of the defined types were placed in this encounter.   No orders of the defined types were placed in this encounter.   Return for after 3/4 for physical.  Cheral MarkerKimberly R  Trystian Crisanto CNM, [facility] 05/25/2018 10:39 AM

## 2018-07-05 ENCOUNTER — Telehealth: Payer: Self-pay | Admitting: Women's Health

## 2018-07-05 NOTE — Telephone Encounter (Signed)
Pt called stating that she was placed an IUD not to long ago and she was told that he wouldn't get a period with it, but pt states that she has been discharging something brown for two months. Pt would like to know if that was normal. Please contact pt

## 2018-07-05 NOTE — Telephone Encounter (Signed)
Patient states since having her IUD placed, she has had brown discharge.  It does have a slight odor but no itching.  Informed patient she could have BV but would need a visit for evaluation. Pt stated she needed an early morning or late afternoon appt.  Next available given to patient.

## 2018-07-12 ENCOUNTER — Ambulatory Visit: Payer: Self-pay | Admitting: Women's Health

## 2020-09-22 ENCOUNTER — Ambulatory Visit (INDEPENDENT_AMBULATORY_CARE_PROVIDER_SITE_OTHER): Payer: Medicaid Other | Admitting: Advanced Practice Midwife

## 2020-09-22 ENCOUNTER — Other Ambulatory Visit (HOSPITAL_COMMUNITY)
Admission: RE | Admit: 2020-09-22 | Discharge: 2020-09-22 | Disposition: A | Discharge status: Home / Self Care | Payer: BC Managed Care – PPO | Source: Ambulatory Visit | Attending: Advanced Practice Midwife | Admitting: Advanced Practice Midwife

## 2020-09-22 ENCOUNTER — Encounter: Payer: Self-pay | Admitting: Advanced Practice Midwife

## 2020-09-22 VITALS — BP 121/78 | HR 76 | Ht 62.25 in | Wt 171.5 lb

## 2020-09-22 DIAGNOSIS — Z01419 Encounter for gynecological examination (general) (routine) without abnormal findings: Secondary | ICD-10-CM | POA: Insufficient documentation

## 2020-09-22 DIAGNOSIS — Z113 Encounter for screening for infections with a predominantly sexual mode of transmission: Secondary | ICD-10-CM

## 2020-09-22 DIAGNOSIS — N760 Acute vaginitis: Secondary | ICD-10-CM

## 2020-09-22 DIAGNOSIS — Z0000 Encounter for general adult medical examination without abnormal findings: Secondary | ICD-10-CM | POA: Insufficient documentation

## 2020-09-22 DIAGNOSIS — N898 Other specified noninflammatory disorders of vagina: Secondary | ICD-10-CM | POA: Diagnosis not present

## 2020-09-22 DIAGNOSIS — B9689 Other specified bacterial agents as the cause of diseases classified elsewhere: Secondary | ICD-10-CM

## 2020-09-22 MED ORDER — FLUOXETINE HCL 10 MG PO CAPS: 10.0000 mg | ORAL_CAPSULE | Freq: Every day | ORAL | 3 refills | Status: DC

## 2020-09-22 MED ORDER — METRONIDAZOLE 0.75 % VA GEL: 1.0000 | Freq: Every day | VAGINAL | 0 refills | Status: DC

## 2020-09-22 NOTE — Patient Instructions (Addendum)
Probiotics for the vagina:  VAGINAL:    OTC products such as Luvena, use 2-3 times a week   ORAL:   UP4 ADULT Superior, compatible and safe strains DDS  -1 L.acidophilus (super strain) with B. Longum, B.Bifidum & B.Infantis Acid-resistant-survives stomach acid and Bile salts   Fortified with prebiotic Fructooligosaccharide to enhance growth and performance Potency: 15 Billion CFU/capsule Dosage: 1 capsule daily, best before a meal.  Amazon:  $21.90 for 60 caps   iF THERE IS STILL A PROBLEM ADD,   FLORAJEN ACIDOPHILUS High Potency Acidophilus Florajen high potency acidophilus is especially effective for restoring and maintaining a healthy, comfortable balance of vaginal flora. It is also beneficial for overall intestinal health and maintaining the immune system. 20 Billion live cultures per capsule Available in 30 and 60 capsules   Amazon:  $19.99 for 60 caps   [place] Pediatricians/Family Doctors:  [facility] [phone]            [facility] [phone]                 [facility] [phone] (usually not accepting new patients unless you have family there already, you are always welcome to call and ask)       [facility] [phone]       [facility]  [facility]  [facility] Pediatricians/Family Doctors:   [facility]: [phone]  [facility]: [phone]  [facility]: [phone]  [facility] Doctors:   [facility]: [phone]   [facility]: [phone]  [facility] Family Doctors:  [facility]: [phone]     Marcelle Overlie Tik [place]

## 2020-09-22 NOTE — Progress Notes (Signed)
Christy Wilkinson 34 y.o.  Vitals:   09/22/20 1409  BP: 121/78  Pulse: 76     Filed Weights   09/22/20 1409  Weight: 171 lb 8 oz (77.8 kg)    Past Medical History: Past Medical History:  Diagnosis Date  . Abnormal Pap smear   . Constipation   . Dizzy 08/28/2014  . Family history of Crohn's disease    Mother  . HSV-2 (herpes simplex virus 2) infection   . IUD (intrauterine device) in place 08/28/2014  . Vaginal Pap smear, abnormal     Past Surgical History: Past Surgical History:  Procedure Laterality Date  . CERVICAL CONE BIOPSY    . CESAREAN SECTION     x 2  . GYN procedure for cervical cancer     scrapping    Family History: Family History  Problem Relation Age of Onset  . Crohn's disease Mother   . Hypertension Maternal Grandmother   . Stroke Maternal Grandmother   . Diabetes Father        borderline  . Hypertension Paternal Grandfather     Social History: Social History   Tobacco Use  . Smoking status: Former Smoker    Types: Cigarettes    Quit date: 09/02/2013    Years since quitting: 7.0  . Smokeless tobacco: Never Used  Vaping Use  . Vaping Use: Never used  Substance Use Topics  . Alcohol use: Yes    Comment: occasionally  . Drug use: No    Allergies:  Allergies  Allergen Reactions  . Zithromax [Azithromycin Dihydrate] Hives      Current Outpatient Medications:  .  Ascorbic Acid (VITAMIN C PO), Take by mouth., Disp: , Rfl:  .  Cholecalciferol (VITAMIN D3 PO), Take by mouth., Disp: , Rfl:  .  ELDERBERRY PO, Take by mouth., Disp: , Rfl:  .  polyethylene glycol powder (MIRALAX) powder, Take 1 Container by mouth. , Disp: , Rfl:  .  Probiotic Product (PROBIOTIC PO), Take by mouth., Disp: , Rfl:   History of Present Illness: Here for pap. Last pap 2018, normal.  Has Liletta IUD (2019).  Has monthy periods, but light  Notices fishy odor  After intercourse  Notices getting more emotional about a week before her period, gets better when her period  starts.  Periods are irregular, but emotional times are always a week or so before she starts bleeding. Has constipation, takes probiotics for the gut. May want tummy tuck to help with the loose skin.   Review of Systems   Patient denies any headaches, blurred vision, shortness of breath, chest pain,problems with urination, or intercourse.   Physical Exam: General:  Well developed, well nourished, no acute distress Skin:  Warm and dry Neck:  Midline trachea, normal thyroid Lungs; Clear to auscultation bilaterally Breast:  No dominant palpable mass, retraction, or nipple discharge Cardiovascular: Regular rate and rhythm Abdomen:  Soft, non tender, no hepatosplenomegaly Pelvic:  External genitalia is normal in appearance.  The vagina is normal in appearance. Thin white dc, some froth.  No overt odor The cervix is bulbous. IUD strings visible. Uterus is felt to be normal size, shape, and contour.  No adnexal masses or tenderness noted.  Extremities:  No swelling or varicosities noted Psych:  No mood changes.     Impression: Normal GYN exam PMS, emotional Chronic BV symptoms   Plan: if pap normal, repeat in 3 years Prozac 10mg  a week before period monthly Metrogel qhs for 5 days; may  use one dose after intercourse, or take a probiotic for the vagina.

## 2020-09-24 LAB — CERVICOVAGINAL ANCILLARY ONLY
Bacterial Vaginitis (gardnerella): POSITIVE — AB
Candida Glabrata: NEGATIVE
Candida Vaginitis: NEGATIVE
Chlamydia: NEGATIVE
Comment: NEGATIVE
Comment: NEGATIVE
Comment: NEGATIVE
Comment: NEGATIVE
Comment: NEGATIVE
Comment: NORMAL
Neisseria Gonorrhea: NEGATIVE
Trichomonas: NEGATIVE

## 2020-09-24 LAB — CYTOLOGY - PAP
Comment: NEGATIVE
Diagnosis: NEGATIVE
High risk HPV: NEGATIVE

## 2021-07-30 ENCOUNTER — Other Ambulatory Visit: Payer: Self-pay

## 2021-07-30 ENCOUNTER — Telehealth: Payer: Self-pay | Admitting: Adult Health

## 2021-07-30 MED ORDER — FLUOXETINE HCL 10 MG PO CAPS: 10.0000 mg | ORAL_CAPSULE | Freq: Every day | ORAL | 3 refills | Status: DC

## 2021-07-30 NOTE — Telephone Encounter (Signed)
Patient called to set up a pap and physical appointment, patient needs a refill of her medication. Please contact pt she is completely out

## 2021-09-14 ENCOUNTER — Other Ambulatory Visit: Payer: Self-pay

## 2021-09-14 ENCOUNTER — Encounter: Payer: BC Managed Care – PPO | Admitting: Adult Health

## 2021-09-15 NOTE — Progress Notes (Signed)
This encounter was created in error - please disregard.

## 2022-02-18 ENCOUNTER — Other Ambulatory Visit: Payer: Self-pay

## 2022-02-18 ENCOUNTER — Ambulatory Visit (INDEPENDENT_AMBULATORY_CARE_PROVIDER_SITE_OTHER): Payer: BC Managed Care – PPO | Admitting: Women's Health

## 2022-02-18 ENCOUNTER — Encounter: Payer: Self-pay | Admitting: Women's Health

## 2022-02-18 VITALS — BP 123/79 | HR 69 | Ht 63.0 in | Wt 187.0 lb

## 2022-02-18 DIAGNOSIS — Z01419 Encounter for gynecological examination (general) (routine) without abnormal findings: Secondary | ICD-10-CM

## 2022-02-18 NOTE — Progress Notes (Signed)
? ?WELL-WOMAN EXAMINATION ?Patient name: Christy Wilkinson MRN [id number]  Date of birth: [birth year] ?Chief Complaint:   ?Gynecologic Exam ? ?History of Present Illness:   ?Christy Wilkinson is a 36 y.o. G65P2012 Caucasian female being seen today for a routine well-woman exam.  ?Current complaints: none ? ?PCP: none      ?does not desire labs ?No LMP recorded. (Menstrual status: IUD). ?The current method of family planning is IUD. Liletta inserted 04/21/18 ?Last pap 09/22/20. Results were: NILM w/ HRHPV negative. H/O abnormal pap: yes ?Last mammogram: never. Results were: N/A. Family h/o breast cancer: no ?Last colonoscopy: never. Results were: N/A. Family h/o colorectal cancer: no ? ?Depression screen [facility] 2/9 02/18/2022 09/22/2020 02/20/2018 01/12/2017  ?Decreased Interest 0 0 0 0  ?Down, Depressed, Hopeless 0 0 0 0  ?PHQ - 2 Score 0 0 0 0  ?Altered sleeping 0 1 - -  ?Tired, decreased energy 1 1 - -  ?Change in appetite 0 0 - -  ?Feeling bad or failure about yourself  0 0 - -  ?Trouble concentrating 0 0 - -  ?Moving slowly or fidgety/restless 0 0 - -  ?Suicidal thoughts 0 0 - -  ?PHQ-9 Score 1 2 - -  ? ?  ?GAD 7 : Generalized Anxiety Score 02/18/2022 09/22/2020  ?Nervous, Anxious, on Edge 0 0  ?Control/stop worrying 0 0  ?Worry too much - different things 0 1  ?Trouble relaxing 0 0  ?Restless 0 0  ?Easily annoyed or irritable 0 0  ?Afraid - awful might happen 0 0  ?Total GAD 7 Score 0 1  ? ? ? ?Review of Systems:   ?Pertinent items are noted in HPI ?Denies any headaches, blurred vision, fatigue, shortness of breath, chest pain, abdominal pain, abnormal vaginal discharge/itching/odor/irritation, problems with periods, bowel movements, urination, or intercourse unless otherwise stated above. ?Pertinent History Reviewed:  ?Reviewed past medical,surgical, social and family history.  ?Reviewed problem list, medications and allergies. ?Physical Assessment:  ? ?Vitals:  ? 02/18/22 1113  ?BP: 123/79  ?Pulse: 69  ?Weight: 187 lb (84.8 kg)   ?Height: 5\' 3"  (1.6 m)  ?Body mass index is 33.13 kg/m?. ?  ?     Physical Examination:  ? General appearance - well appearing, and in no distress ? Mental status - alert, oriented to person, place, and time ? Psych:  She has a normal mood and affect ? Skin - warm and dry, normal color, no suspicious lesions noted ? Chest - effort normal, all lung fields clear to auscultation bilaterally ? Heart - normal rate and regular rhythm ? Neck:  midline trachea, no thyromegaly or nodules ? Breasts - breasts appear normal, no suspicious masses, no skin or nipple changes or  axillary nodes ? Abdomen - soft, nontender, nondistended, no masses or organomegaly ? Pelvic - VULVA: normal appearing vulva with no masses, tenderness or lesions  VAGINA: normal appearing vagina with normal color and discharge, no lesions  CERVIX: normal appearing cervix without discharge or lesions, no CMT, IUD strings visible ? Thin prep pap is not done w/ HR HPV cotesting ? UTERUS: uterus is felt to be normal size, shape, consistency and nontender  ? ADNEXA: No adnexal masses or tenderness noted. ? Extremities:  No swelling or varicosities noted ? ?Chaperone: Malachy Mood   ? ?No results found for this or any previous visit (from the past 24 hour(s)).  ?Assessment & Plan:  ?1) Well-Woman Exam ? ?Labs/procedures today: none ? ?Mammogram: @ 35yo, or  sooner if problems ?Colonoscopy: @ 35yo, or sooner if problems ? ?No orders of the defined types were placed in this encounter. ? ? ?Meds: No orders of the defined types were placed in this encounter. ? ? ?Follow-up: Return in about 1 year (around 02/19/2023) for Pap & physical. ? ?Roma Schanz CNM, WHNP-BC ?02/18/2022 ?11:45 AM  ?

## 2022-05-07 ENCOUNTER — Ambulatory Visit
Admission: EM | Admit: 2022-05-07 | Discharge: 2022-05-07 | Disposition: A | Discharge status: Home / Self Care | Payer: BC Managed Care – PPO | Attending: Family Medicine | Admitting: Family Medicine

## 2022-05-07 ENCOUNTER — Encounter: Payer: Self-pay | Admitting: Emergency Medicine

## 2022-05-07 ENCOUNTER — Ambulatory Visit (INDEPENDENT_AMBULATORY_CARE_PROVIDER_SITE_OTHER): Payer: BC Managed Care – PPO

## 2022-05-07 DIAGNOSIS — M79672 Pain in left foot: Secondary | ICD-10-CM

## 2022-05-07 MED ORDER — DEXAMETHASONE SODIUM PHOSPHATE 10 MG/ML IJ SOLN
10.0000 mg | Freq: Once | INTRAMUSCULAR | Status: AC
  Administered 2022-05-07: 10 mg via INTRAMUSCULAR

## 2022-05-07 NOTE — Discharge Instructions (Signed)
[facility] [address] [place], [place] [address] [phone]

## 2022-05-07 NOTE — ED Triage Notes (Signed)
Bruising and swelling noted to inner side of left foot around great toe area since yesterday. No known injury.

## 2022-05-07 NOTE — ED Provider Notes (Signed)
[facility]    CSN: [id number] Arrival date & time: 05/07/22  1111      History   Chief Complaint No chief complaint on file.  HPI Christy Wilkinson is a 36 y.o. female.   Presenting today with 1 day history of left great toe pain, swelling that started yesterday.  She denies any known injury, fever, skin changes, numbness, tingling but does have decreased range of motion in the great toe.  So far took a Tylenol PM last night but otherwise not trying anything over-the-counter for symptoms.  Try to go to work today and put on her steel toed boot and walked a long distance into her office which made things significantly worse.  Past Medical History:  Diagnosis Date   Abnormal Pap smear    Constipation    Dizzy 08/28/2014   Family history of Crohn's disease    Mother   HSV-2 (herpes simplex virus 2) infection    IUD (intrauterine device) in place 08/28/2014   Vaginal Pap smear, abnormal     Patient Active Problem List   Diagnosis Date Noted   Encounter for IUD insertion 04/21/2018   BV (bacterial vaginosis) 05/21/2016   HSV-2 (herpes simplex virus 2) infection 05/18/2013   Past Surgical History:  Procedure Laterality Date   CERVICAL CONE BIOPSY     CESAREAN SECTION     x 2   GYN procedure for cervical cancer     scrapping    OB History     Gravida  3   Para  2   Term  2   Preterm      AB  1   Living  2      SAB  1   IAB      Ectopic      Multiple      Live Births  2            Home Medications    Prior to Admission medications   Medication Sig Start Date End Date Taking? Authorizing Provider  Ascorbic Acid (VITAMIN C PO) Take by mouth.    [provider]  Cholecalciferol (VITAMIN D3 PO) Take by mouth.    [provider]  COLLAGEN PO Take by mouth.    [provider]  ELDERBERRY PO Take by mouth.    [provider]  FLUoxetine (PROZAC) 10 MG capsule Take 1 capsule (10 mg total) by mouth daily.  07/30/21   Adline PotterGriffin, Jennifer A, NP  levonorgestrel (LILETTA, 52 MG,) 20.1 MCG/DAY IUD 1 each by Intrauterine route once.    [provider]  metroNIDAZOLE (METROGEL VAGINAL) 0.75 % vaginal gel Place 1 Applicatorful vaginally at bedtime. FOR 5 NIGHTS 09/22/20   Cresenzo-Dishmon, Scarlette CalicoFrances, CNM  Probiotic Product (PROBIOTIC PO) Take by mouth.    [provider]    Family History Family History  Problem Relation Age of Onset   Crohn's disease Mother    Hypertension Maternal Grandmother    Stroke Maternal Grandmother    Diabetes Father        borderline   Hypertension Paternal Grandfather    Social History Social History   Tobacco Use   Smoking status: Former    Types: Cigarettes    Quit date: 09/02/2013    Years since quitting: 8.6   Smokeless tobacco: Never  Vaping Use   Vaping Use: Never used  Substance Use Topics   Alcohol use: Yes    Comment: occasionally   Drug use: No  Allergies   Zithromax [azithromycin dihydrate]   Review of Systems Review of Systems Per HPI  Physical Exam Triage Vital Signs ED Triage Vitals  Enc Vitals Group     BP 05/07/22 1251 112/74     Pulse Rate 05/07/22 1251 74     Resp 05/07/22 1251 18     Temp 05/07/22 1251 98.1 F (36.7 C)     Temp Source 05/07/22 1251 Oral     SpO2 05/07/22 1251 98 %     Weight --      Height --      Head Circumference --      Peak Flow --      Pain Score 05/07/22 1253 9     Pain Loc --      Pain Edu? --      Excl. in GC? --    No data found.  Updated Vital Signs BP 112/74 (BP Location: Right Arm)   Pulse 74   Temp 98.1 F (36.7 C) (Oral)   Resp 18   LMP 04/24/2022 (Exact Date)   SpO2 98%   Visual Acuity Right Eye Distance:   Left Eye Distance:   Bilateral Distance:    Right Eye Near:   Left Eye Near:    Bilateral Near:     Physical Exam Vitals and nursing note reviewed.  Constitutional:      Appearance: Normal appearance. She is not ill-appearing.  HENT:     Head:  Atraumatic.  Eyes:     Extraocular Movements: Extraocular movements intact.     Conjunctiva/sclera: Conjunctivae normal.  Cardiovascular:     Rate and Rhythm: Normal rate and regular rhythm.     Heart sounds: Normal heart sounds.  Pulmonary:     Effort: Pulmonary effort is normal.     Breath sounds: Normal breath sounds.  Musculoskeletal:        General: Swelling and tenderness present. No deformity or signs of injury.     Cervical back: Normal range of motion and neck supple.     Comments: Creased range of motion to the left great toe.  Tender to palpation diffusely at base ending into medial left foot plantar surface  Skin:    General: Skin is warm and dry.     Findings: Erythema present. No bruising.  Neurological:     Mental Status: She is alert and oriented to person, place, and time.     Sensory: No sensory deficit.     Motor: No weakness.     Gait: Gait normal.     Comments: Left foot neurovascularly intact  Psychiatric:        Mood and Affect: Mood normal.        Thought Content: Thought content normal.        Judgment: Judgment normal.     UC Treatments / Results  Labs (all labs ordered are listed, but only abnormal results are displayed) Labs Reviewed - No data to display  EKG   Radiology DG Foot Complete Left  Result Date: 05/07/2022 CLINICAL DATA:  Pain and swelling left first MTP joint.  No injury. EXAM: LEFT FOOT - COMPLETE 3+ VIEW COMPARISON:  None Available. FINDINGS: There is no evidence of fracture or dislocation. There is no evidence of arthropathy or other focal bone abnormality. Soft tissues are unremarkable. IMPRESSION: Negative. Electronically Signed   By: Elberta Fortis M.D.   On: 05/07/2022 13:10    Procedures Procedures (including critical care time)  Medications Ordered in UC  Medications  dexamethasone (DECADRON) injection 10 mg (has no administration in time range)    Initial Impression / Assessment and Plan / UC Course  I have reviewed  the triage vital signs and the nursing notes.  Pertinent labs & imaging results that were available during my care of the patient were reviewed by me and considered in my medical decision making (see chart for details).     X-ray negative for acute bony abnormality.  Treat with IM Decadron, postop shoe for comfort as she is having difficulty with ambulation, over-the-counter pain relievers, Epsom salt, ice, elevation.  Follow-up with podiatry if not resolving.  Work note given.  Final Clinical Impressions(s) / UC Diagnoses   Final diagnoses:  Left foot pain     Discharge Instructions      [facility] [address] [place], [place] [address] [phone]    ED Prescriptions   None    PDMP not reviewed this encounter.   Particia Nearing, [place] 05/07/22 1330

## 2022-05-09 ENCOUNTER — Other Ambulatory Visit: Payer: Self-pay

## 2022-05-09 ENCOUNTER — Encounter (HOSPITAL_COMMUNITY): Payer: Self-pay

## 2022-05-09 ENCOUNTER — Emergency Department (HOSPITAL_COMMUNITY)
Admission: EM | Admit: 2022-05-09 | Discharge: 2022-05-10 | Disposition: A | Discharge status: Home / Self Care | Payer: BC Managed Care – PPO | Attending: Emergency Medicine | Admitting: Emergency Medicine

## 2022-05-09 DIAGNOSIS — M79675 Pain in left toe(s): Secondary | ICD-10-CM | POA: Insufficient documentation

## 2022-05-09 DIAGNOSIS — M79672 Pain in left foot: Secondary | ICD-10-CM | POA: Diagnosis not present

## 2022-05-09 NOTE — ED Triage Notes (Signed)
Pt c/o left foot pain & swelling since Thursday night. Pt went to Urgent Care, did xrays & gave steroid shot. Pt reports still having pain & swelling.

## 2022-05-10 LAB — URIC ACID: Uric Acid, Serum: 5.3 mg/dL (ref 2.5–7.1)

## 2022-05-10 MED ORDER — OXYCODONE-ACETAMINOPHEN 5-325 MG PO TABS
1.0000 | ORAL_TABLET | Freq: Once | ORAL | Status: AC
  Administered 2022-05-10: 1 via ORAL
  Filled 2022-05-10: qty 1

## 2022-05-10 MED ORDER — OXYCODONE-ACETAMINOPHEN 5-325 MG PO TABS: 1.0000 | ORAL_TABLET | ORAL | 0 refills | Status: DC | PRN

## 2022-05-10 MED ORDER — NAPROXEN 250 MG PO TABS
500.0000 mg | ORAL_TABLET | Freq: Once | ORAL | Status: AC
  Administered 2022-05-10: 500 mg via ORAL
  Filled 2022-05-10: qty 2

## 2022-05-10 MED ORDER — NAPROXEN 500 MG PO TABS: 500.0000 mg | ORAL_TABLET | Freq: Two times a day (BID) | ORAL | 0 refills | Status: DC

## 2022-05-10 MED ORDER — PREDNISONE 20 MG PO TABS: 60.0000 mg | ORAL_TABLET | Freq: Every day | ORAL | 0 refills | Status: DC

## 2022-05-10 MED ORDER — PREDNISONE 50 MG PO TABS
60.0000 mg | ORAL_TABLET | Freq: Once | ORAL | Status: AC
  Administered 2022-05-10: 60 mg via ORAL
  Filled 2022-05-10: qty 1

## 2022-05-10 NOTE — Discharge Instructions (Signed)
Apply ice for 30 minutes at a time, 4 times a day.  Follow-up with the orthopedic physician if symptoms or not improving with the medications prescribed today.

## 2022-05-10 NOTE — ED Provider Notes (Signed)
[facility] EMERGENCY DEPARTMENT Provider Note   CSN: [id number] Arrival date & time: 05/09/22  2325     History  Chief Complaint  Patient presents with   Foot Pain    Christy Wilkinson is a 36 y.o. female.  The history is provided by the patient.  Foot Pain She has no significant past history and comes in complaining of pain and swelling of her left great toe which started 4 days ago.  She denies any trauma but did start a new job where she has to wear boots which have not been broken in yet.  She went to an urgent care center where x-rays were obtained and she was given an injection of a steroid but has not noticed any improvement since then.  She continues to complain of severe, constant pain in her left great toe.  She denies any prior similar episodes.  She did try taking some aspirin which did not give her any relief.   Home Medications Prior to Admission medications   Medication Sig Start Date End Date Taking? Authorizing Provider  Ascorbic Acid (VITAMIN C PO) Take by mouth.    [provider]  Cholecalciferol (VITAMIN D3 PO) Take by mouth.    [provider]  COLLAGEN PO Take by mouth.    [provider]  ELDERBERRY PO Take by mouth.    [provider]  FLUoxetine (PROZAC) 10 MG capsule Take 1 capsule (10 mg total) by mouth daily. 07/30/21   Adline Potter, NP  levonorgestrel (LILETTA, 52 MG,) 20.1 MCG/DAY IUD 1 each by Intrauterine route once.    [provider]  metroNIDAZOLE (METROGEL VAGINAL) 0.75 % vaginal gel Place 1 Applicatorful vaginally at bedtime. FOR 5 NIGHTS 09/22/20   Cresenzo-Dishmon, Scarlette Calico, CNM  Probiotic Product (PROBIOTIC PO) Take by mouth.    [provider]      Allergies    Zithromax [azithromycin dihydrate]    Review of Systems   Review of Systems  All other systems reviewed and are negative.  Physical Exam Updated Vital Signs BP 124/71 (BP Location: Right Arm)   Pulse 97   Temp 97.9 F  (36.6 C) (Oral)   Resp 18   Ht 5\' 3"  (1.6 m)   Wt 83.9 kg   LMP 04/24/2022 (Exact Date)   SpO2 98%   BMI 32.77 kg/m  Physical Exam Vitals and nursing note reviewed.  37 year old female, appears uncomfortable, but is in no acute distress. Vital signs are normal. Oxygen saturation is 98%, which is normal. Head is normocephalic and atraumatic. PERRLA, EOMI. Oropharynx is clear. Neck is nontender and supple without adenopathy or JVD. Back is nontender and there is no CVA tenderness. Lungs are clear without rales, wheezes, or rhonchi. Chest is nontender. Heart has regular rate and rhythm without murmur. Abdomen is soft, flat, nontender. Extremities: There is erythema and swelling in the region of the left first MTP joint with slight warmth and marked tenderness over the same area.  Remainder of extremity exam is normal. Skin is warm and dry without rash. Neurologic: Mental status is normal, cranial nerves are intact, moves all extremities equally.  ED Results / Procedures / Treatments   Labs (all labs ordered are listed, but only abnormal results are displayed) Labs Reviewed  URIC ACID   Procedures Procedures    Medications Ordered in ED Medications  oxyCODONE-acetaminophen (PERCOCET/ROXICET) 5-325 MG per tablet 1 tablet (has no administration in time range)  naproxen (NAPROSYN) tablet 500 mg (  has no administration in time range)  predniSONE (DELTASONE) tablet 60 mg (has no administration in time range)    ED Course/ Medical Decision Making/ A&P                           Medical Decision Making Amount and/or Complexity of Data Reviewed Labs: ordered.  Risk Prescription drug management.   Pain, warmth, redness, swelling of the left first MTP joint strongly suggestive of gout.  However, I would have expected some improvement with steroids.  Old records reviewed showing urgent care visit on 5/19 at which time x-rays of the left foot were normal.  I have independently viewed  these images, and agree with the radiologist's interpretation.  We will check uric acid level today and start her on a higher dose of steroids.  She is given a dose of prednisone 60 mg and also given naproxen for additional anti-inflammatory effect.  She is also given a dose of oxycodone-acetaminophen for pain.  She feels somewhat better following above-noted treatment.  I have reviewed and interpreted the uric acid level, and it is normal.  This does not rule out gout, but does make it less likely.  Also, this could be pseudogout even though MCP joint is an unusual location for pseudogout.  She is discharged with prescription for prednisone 60 mg daily for 5 days, naproxen 500 mg twice a day, and oxycodone-acetaminophen for pain.  She is referred to orthopedics for follow-up if she does not have clinical improvement with above-noted treatment regimen.  Final Clinical Impression(s) / ED Diagnoses Final diagnoses:  Pain of left great toe    Rx / DC Orders ED Discharge Orders          Ordered    naproxen (NAPROSYN) 500 MG tablet  2 times daily        05/10/22 0313    predniSONE (DELTASONE) 20 MG tablet  Daily        05/10/22 0313    oxyCODONE-acetaminophen (PERCOCET) 5-325 MG tablet  Every 4 hours PRN        05/10/22 0313    oxyCODONE-acetaminophen (PERCOCET) 5-325 MG tablet  Every 4 hours PRN        05/10/22 0313              Dione Booze, MD 05/10/22 [phone]

## 2022-05-10 NOTE — ED Notes (Signed)
Patient verbalizes understanding of discharge instructions. Opportunity for questioning and answers were provided. Armband removed by staff, pt discharged from ED. Ambulated with crutches out to lobby  

## 2022-05-13 ENCOUNTER — Encounter: Payer: Self-pay | Admitting: Orthopedic Surgery

## 2022-05-13 ENCOUNTER — Ambulatory Visit: Payer: BC Managed Care – PPO | Admitting: Orthopedic Surgery

## 2022-05-13 VITALS — BP 128/80 | HR 82 | Ht 63.0 in | Wt 185.0 lb

## 2022-05-13 DIAGNOSIS — M25872 Other specified joint disorders, left ankle and foot: Secondary | ICD-10-CM | POA: Diagnosis not present

## 2022-05-13 DIAGNOSIS — M659 Synovitis and tenosynovitis, unspecified: Secondary | ICD-10-CM

## 2022-05-13 MED ORDER — PREDNISONE 10 MG PO TABS
10.0000 mg | ORAL_TABLET | Freq: Three times a day (TID) | ORAL | 0 refills | Status: AC
Start: 2022-05-13 — End: 2022-05-18

## 2022-05-13 NOTE — Patient Instructions (Addendum)
Finish the prednisone from the ER then take the prescription from Dr Aline Brochure he will send in the prednisone at different dose   If you need on [place] closed toe cam walker boot

## 2022-05-13 NOTE — Progress Notes (Signed)
Chief Complaint  Patient presents with   Foot Pain    LT great toe pain Dx as gout at ED   She has no significant past history and comes in complaining of pain and swelling of her left great toe which started 4 days ago.  She denies any trauma but did start a new job where she has to wear boots which have not been broken in yet.  She went to an urgent care center where x-rays were obtained and she was given an injection of a steroid but has not noticed any improvement since then.  She continues to complain of severe, constant pain in her left great toe.  She denies any prior similar episodes.  She did try taking some aspirin which did not give her any relief.  Pain, warmth, redness, swelling of the left first MTP joint strongly suggestive of gout.  However, I would have expected some improvement with steroids.  Old records reviewed showing urgent care visit on 5/19 at which time x-rays of the left foot were normal.  I have independently viewed these images, and agree with the radiologist's interpretation.  We will check uric acid level today and start her on a higher dose of steroids.  She is given a dose of prednisone 60 mg and also given naproxen for additional anti-inflammatory effect.  She is also given a dose of oxycodone-acetaminophen for pain.  She is discharged with prescription for prednisone 60 mg daily for 5 days, naproxen 500 mg twice a day, and oxycodone-acetaminophen for pain.  She is referred to orthopedics for follow-up if she does not have clinical improvement with above-noted treatment regimen.  36 year old female with presentation listed above.  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  She went to urgent care got a little bit of relief but then had to go to the ER as the pain did not subside and got worse.  She seemed to respond to oral steroids and NSAIDs  Comes in today  with her work shoe on but still some tenderness primarily plantar aspect left foot no history of trauma no provocative lifestyle habits that would suggest gout  ER records indicate uric acid level was normal  Past Medical History:  Diagnosis Date   Abnormal Pap smear    Constipation    Dizzy 08/28/2014   Family history of Crohn's disease    Mother   HSV-2 (herpes simplex virus 2) infection    IUD (intrauterine device) in place 08/28/2014   Vaginal Pap smear, abnormal    Past Surgical History:  Procedure Laterality Date   CERVICAL CONE BIOPSY     CESAREAN SECTION     x 2   GYN procedure for cervical cancer     scrapping    BP 128/80   Pulse 82   Ht 5\' 3"  (1.6 m)   Wt 185 lb (83.9 kg)   LMP 04/24/2022 (Exact Date)   BMI 32.77 kg/m    Left great toe tenderness primarily in the posterior portion and flexor tendons and sesamoids with some mild tenderness dorsally some pain with extreme dorsiflexion of the great toe toe alignment normal color capillary refill normal temperature normal sensation normal   Images done prior to visit no fracture dislocation or bunion deformity no arthritis or soft tissue swelling  Impression  Encounter Diagnoses  Name Primary?  Synovitis of toe Yes   Sesamoiditis of left foot     She will continue with the Deltasone that she is on and once it is finished she will taper down to 10 mg 3 times a day and then continue her naproxen  If necessary she will go to a capped toe cam walker which she can get [place]  She will follow-up with [place] after July 1  Meds ordered this encounter  Medications   predniSONE (DELTASONE) 10 MG tablet    Sig: Take 1 tablet (10 mg total) by mouth 3 (three) times daily for 5 days.    Dispense:  15 tablet    Refill:  0     Meds ordered this encounter  Medications   predniSONE (DELTASONE) 10 MG tablet    Sig: Take 1 tablet (10 mg total) by mouth 3 (three) times daily for 5 days.    Dispense:  15 tablet     Refill:  0

## 2022-06-24 ENCOUNTER — Ambulatory Visit: Payer: BC Managed Care – PPO | Admitting: Orthopedic Surgery

## 2022-06-25 ENCOUNTER — Encounter: Payer: Self-pay | Admitting: Orthopedic Surgery

## 2023-02-17 ENCOUNTER — Encounter: Payer: Self-pay | Admitting: Radiology

## 2023-03-29 ENCOUNTER — Encounter: Payer: Self-pay | Admitting: Women's Health

## 2023-03-29 ENCOUNTER — Ambulatory Visit: Payer: BLUE CROSS/BLUE SHIELD | Admitting: Women's Health

## 2023-03-29 ENCOUNTER — Other Ambulatory Visit (HOSPITAL_COMMUNITY)
Admission: RE | Admit: 2023-03-29 | Discharge: 2023-03-29 | Disposition: A | Discharge status: Home / Self Care | Payer: BLUE CROSS/BLUE SHIELD | Source: Ambulatory Visit | Attending: Women's Health | Admitting: Women's Health

## 2023-03-29 VITALS — BP 109/71 | HR 64 | Ht 63.0 in | Wt 188.0 lb

## 2023-03-29 DIAGNOSIS — R300 Dysuria: Secondary | ICD-10-CM

## 2023-03-29 DIAGNOSIS — Z01419 Encounter for gynecological examination (general) (routine) without abnormal findings: Secondary | ICD-10-CM | POA: Diagnosis present

## 2023-03-29 DIAGNOSIS — R5383 Other fatigue: Secondary | ICD-10-CM

## 2023-03-29 DIAGNOSIS — K5900 Constipation, unspecified: Secondary | ICD-10-CM

## 2023-03-29 DIAGNOSIS — F419 Anxiety disorder, unspecified: Secondary | ICD-10-CM | POA: Diagnosis not present

## 2023-03-29 LAB — POCT URINALYSIS DIPSTICK OB
Blood, UA: NEGATIVE
Glucose, UA: NEGATIVE
Ketones, UA: NEGATIVE
Leukocytes, UA: NEGATIVE
Nitrite, UA: NEGATIVE
POC,PROTEIN,UA: NEGATIVE

## 2023-03-29 MED ORDER — FLUOXETINE HCL 10 MG PO CAPS: 10.0000 mg | ORAL_CAPSULE | Freq: Every day | ORAL | 3 refills | Status: DC

## 2023-03-29 NOTE — Addendum Note (Signed)
Addended by: Federico Flake A on: 03/29/2023 11:51 AM   Modules accepted: Orders

## 2023-03-29 NOTE — Patient Instructions (Signed)

## 2023-03-29 NOTE — Progress Notes (Signed)
WELL-WOMAN EXAMINATION Patient name: Christy Wilkinson MRN [id number]  Date of birth: 04-05-1986 Chief Complaint:   Gynecologic Exam (Pap/physcial/ 09-22-20 pap negative and HPV negative)  History of Present Illness:   Christy Wilkinson is a 37 y.o. G79P2012 Caucasian female being seen today for a routine well-woman exam.  Current complaints: fatigue, gets up at 0400 and works out, then works all day and it just exhausted when gets home. [place], burning w/ urination. Denies abnormal discharge, itching/odor/irritation.   Was on prozac in past for anxiety, would like to restart.  Constipation, uses miralax, helps some  PCP: none      No LMP recorded. (Menstrual status: IUD). The current method of family planning is IUD. Liletta inserted 04/21/18 Last pap 09/22/20. Results were: NILM w/ HRHPV negative. H/O abnormal pap: yes Last mammogram: never. Results were: N/A. Family h/o breast cancer: no Last colonoscopy: never. Results were: N/A. Family h/o colorectal cancer: no     03/29/2023   10:57 AM 02/18/2022   11:10 AM 09/22/2020    2:23 PM 02/20/2018    4:13 PM 01/12/2017   11:53 AM  Depression screen PHQ 2/9  Decreased Interest 0 0 0 0 0  Down, Depressed, Hopeless 0 0 0 0 0  PHQ - 2 Score 0 0 0 0 0  Altered sleeping 1 0 1    Tired, decreased energy 0 1 1    Change in appetite 0 0 0    Feeling bad or failure about yourself  0 0 0    Trouble concentrating 0 0 0    Moving slowly or fidgety/restless 0 0 0    Suicidal thoughts 0 0 0    PHQ-9 Score 1 1 2           03/29/2023   10:57 AM 02/18/2022   11:11 AM 09/22/2020    2:23 PM  GAD 7 : Generalized Anxiety Score  Nervous, Anxious, on Edge 1 0 0  Control/stop worrying 0 0 0  Worry too much - different things 0 0 1  Trouble relaxing 0 0 0  Restless 0 0 0  Easily annoyed or irritable 0 0 0  Afraid - awful might happen 0 0 0  Total GAD 7 Score 1 0 1     Review of Systems:   Pertinent items are noted in  HPI Denies any headaches, blurred vision, fatigue, shortness of breath, chest pain, abdominal pain, abnormal vaginal discharge/itching/odor/irritation, problems with periods, bowel movements, urination, or intercourse unless otherwise stated above. Pertinent History Reviewed:  Reviewed past medical,surgical, social and family history.  Reviewed problem list, medications and allergies. Physical Assessment:   Vitals:   03/29/23 1052  BP: 109/71  Pulse: 64  Weight: 188 lb (85.3 kg)  Height: 5\' 3"  (1.6 m)  Body mass index is 33.3 kg/m.        Physical Examination:   General appearance - well appearing, and in no distress  Mental status - alert, oriented to person, place, and time  Psych:  She has a normal mood and affect  Skin - warm and dry, normal color, no suspicious lesions noted  Chest - effort normal, all lung fields clear to auscultation bilaterally  Heart - normal rate and regular rhythm  Neck:  midline trachea, no thyromegaly or nodules  Breasts - breasts appear normal, no suspicious masses, no skin or nipple changes or  axillary nodes  Abdomen - soft, nontender, nondistended,  no masses or organomegaly  Pelvic - VULVA: normal appearing vulva with no masses, tenderness or lesions  VAGINA: normal appearing vagina with normal color and discharge, no lesions  CERVIX: normal appearing cervix without discharge or lesions, no CMT, IUD strings visible  Thin prep pap is done w/  HR HPV cotesting  UTERUS: uterus is felt to be normal size, shape, consistency and nontender   ADNEXA: No adnexal masses or tenderness noted.  Extremities:  No swelling or varicosities noted  Chaperone: Peggy Dones    No results found for this or any previous visit (from the past 24 hour(s)).  Assessment & Plan:  1) Well-Woman Exam  2) Anxiety> refilled prozac  3) Dysuria> dipstick neg, will send cx  4) Fatigue> check CBC, TSH  5) Constipation> gave printed prevention/relief measures    Labs/procedures today: pap, labs  Mammogram: @ 36yo, or sooner if problems Colonoscopy: @ 36yo, or sooner if problems  Orders Placed This Encounter  Procedures   Urine Culture   TSH   CBC    Meds:  Meds ordered this encounter  Medications   FLUoxetine (PROZAC) 10 MG capsule    Sig: Take 1 capsule (10 mg total) by mouth daily.    Dispense:  90 capsule    Refill:  3    Follow-up: Return in about 1 year (around 03/28/2024) for Physical.  Cheral Marker CNM, [facility]NP-[facility] 03/29/2023 11:31 AM

## 2023-03-30 LAB — CBC
Hematocrit: 43.1 % (ref 34.0–46.6)
Hemoglobin: 14.5 g/dL (ref 11.1–15.9)
MCH: 31.7 pg (ref 26.6–33.0)
MCHC: 33.6 g/dL (ref 31.5–35.7)
MCV: 94 fL (ref 79–97)
Platelets: 250 10*3/uL (ref 150–450)
RBC: 4.58 x10E6/uL (ref 3.77–5.28)
RDW: 12.7 % (ref 11.7–15.4)
WBC: 8.5 10*3/uL (ref 3.4–10.8)

## 2023-03-30 LAB — TSH: TSH: 1.55 u[IU]/mL (ref 0.450–4.500)

## 2023-03-31 LAB — CYTOLOGY - PAP
Adequacy: ABSENT
Comment: NEGATIVE
Diagnosis: NEGATIVE
High risk HPV: NEGATIVE

## 2023-03-31 LAB — URINE CULTURE

## 2023-04-04 MED ORDER — FLUCONAZOLE 150 MG PO TABS: 150.0000 mg | ORAL_TABLET | Freq: Once | ORAL | 0 refills | Status: AC

## 2023-04-04 NOTE — Addendum Note (Signed)
Addended by: Cheral Marker on: 04/04/2023 09:45 AM   Modules accepted: Orders

## 2023-06-15 ENCOUNTER — Encounter: Payer: Self-pay | Admitting: Women's Health

## 2023-06-16 ENCOUNTER — Other Ambulatory Visit: Payer: Self-pay | Admitting: Adult Health

## 2023-06-16 MED ORDER — METRONIDAZOLE 500 MG PO TABS: 500.0000 mg | ORAL_TABLET | Freq: Two times a day (BID) | ORAL | 0 refills | Status: DC

## 2023-06-16 NOTE — Progress Notes (Signed)
Rx flagyl 

## 2023-10-12 ENCOUNTER — Other Ambulatory Visit: Payer: Self-pay | Admitting: Adult Health

## 2023-10-12 MED ORDER — METRONIDAZOLE 500 MG PO TABS: 500.0000 mg | ORAL_TABLET | Freq: Two times a day (BID) | ORAL | 0 refills | Status: DC

## 2023-10-12 NOTE — Progress Notes (Signed)
Refilled flagyl 

## 2023-12-28 IMAGING — DX DG FOOT COMPLETE 3+V*L*
3 series · 3 of 3 positions shown · non-contrast
Comparison: None Available.

CLINICAL DATA: Pain and swelling left first MTP joint.  No injury.

EXAM:
LEFT FOOT - COMPLETE 3+ VIEW

[foot ap]
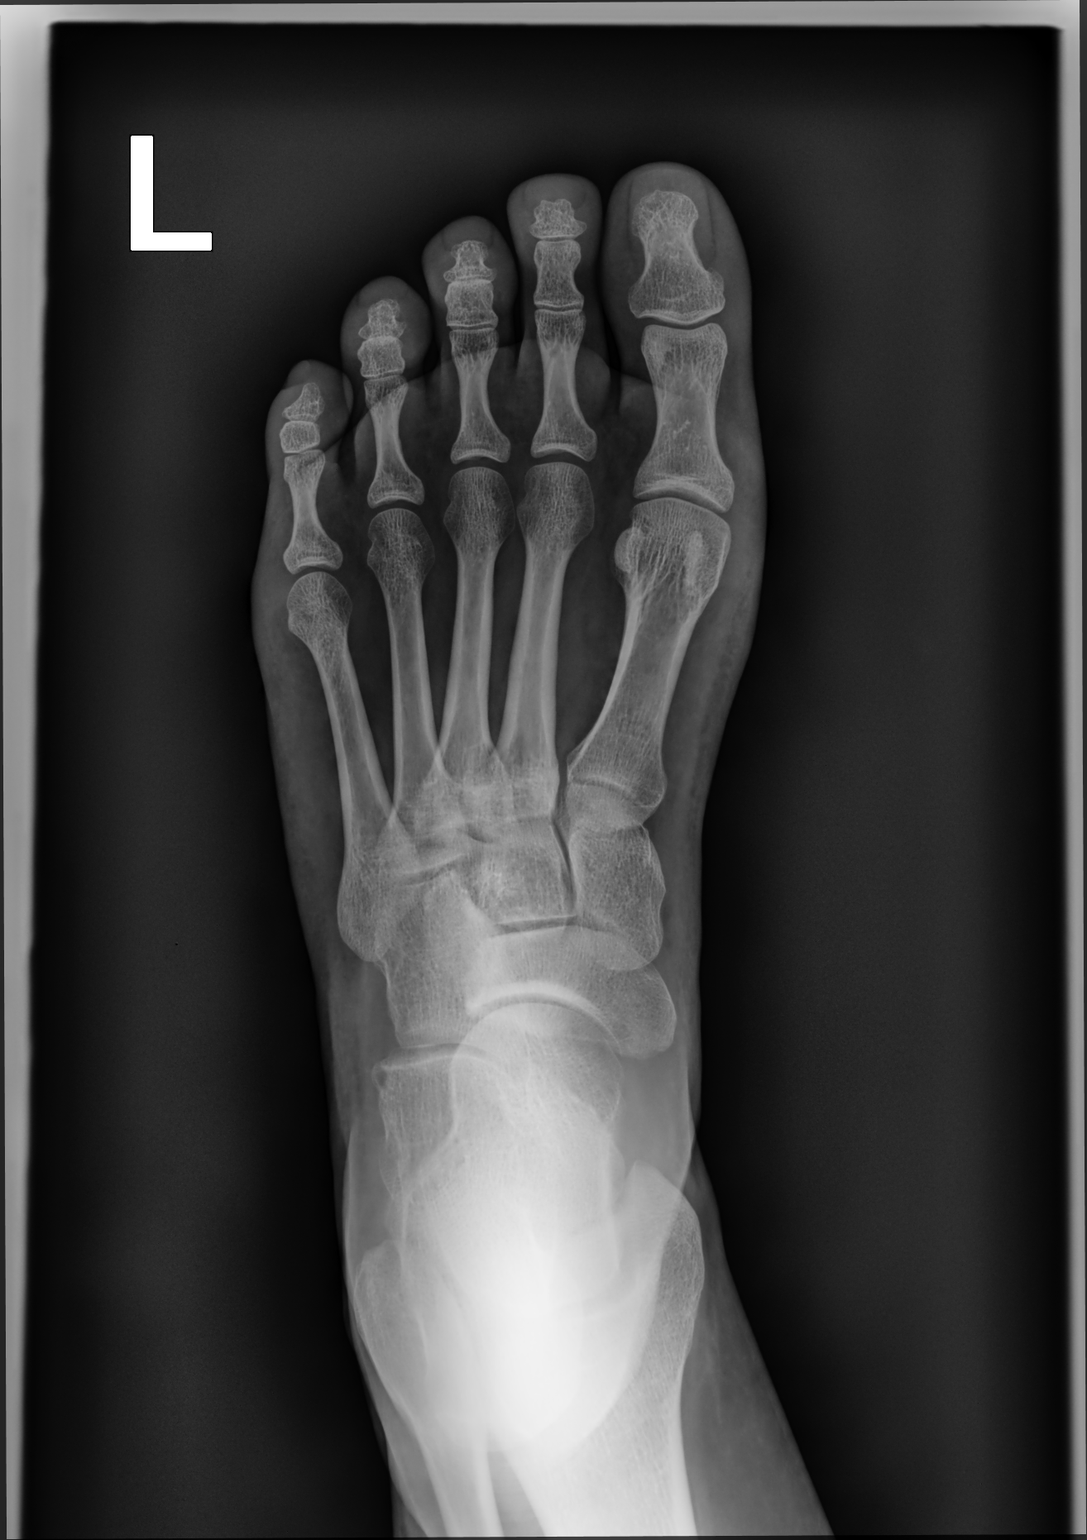

[foot mlo]
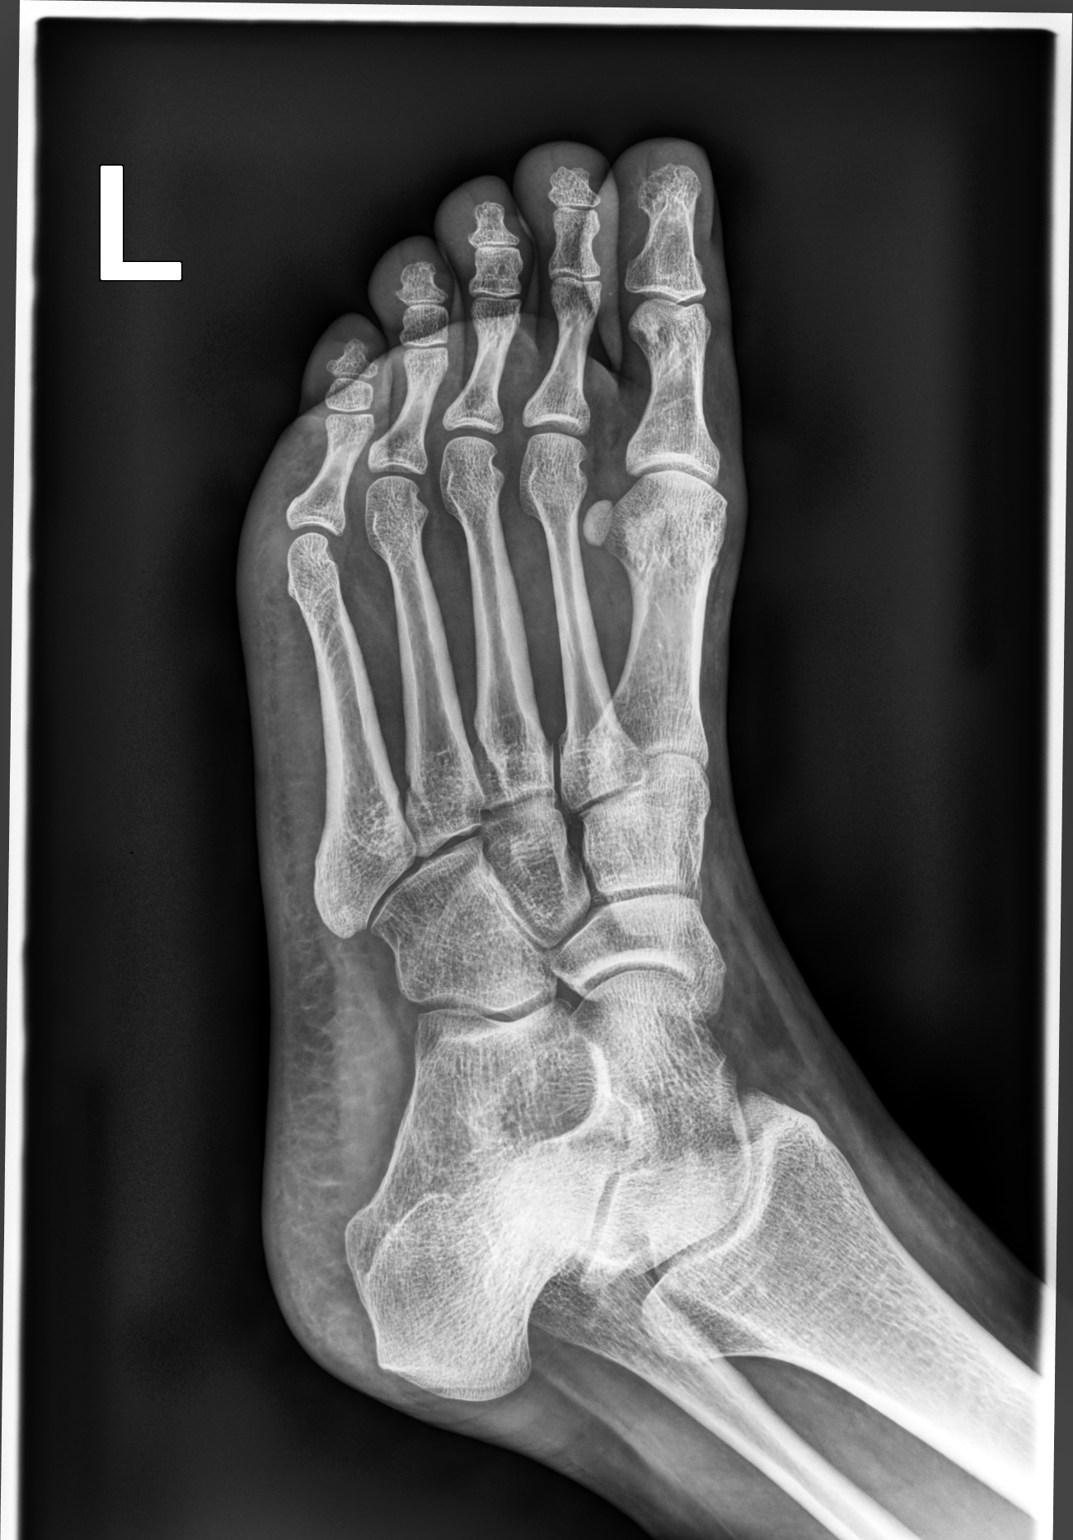

[foot lat]
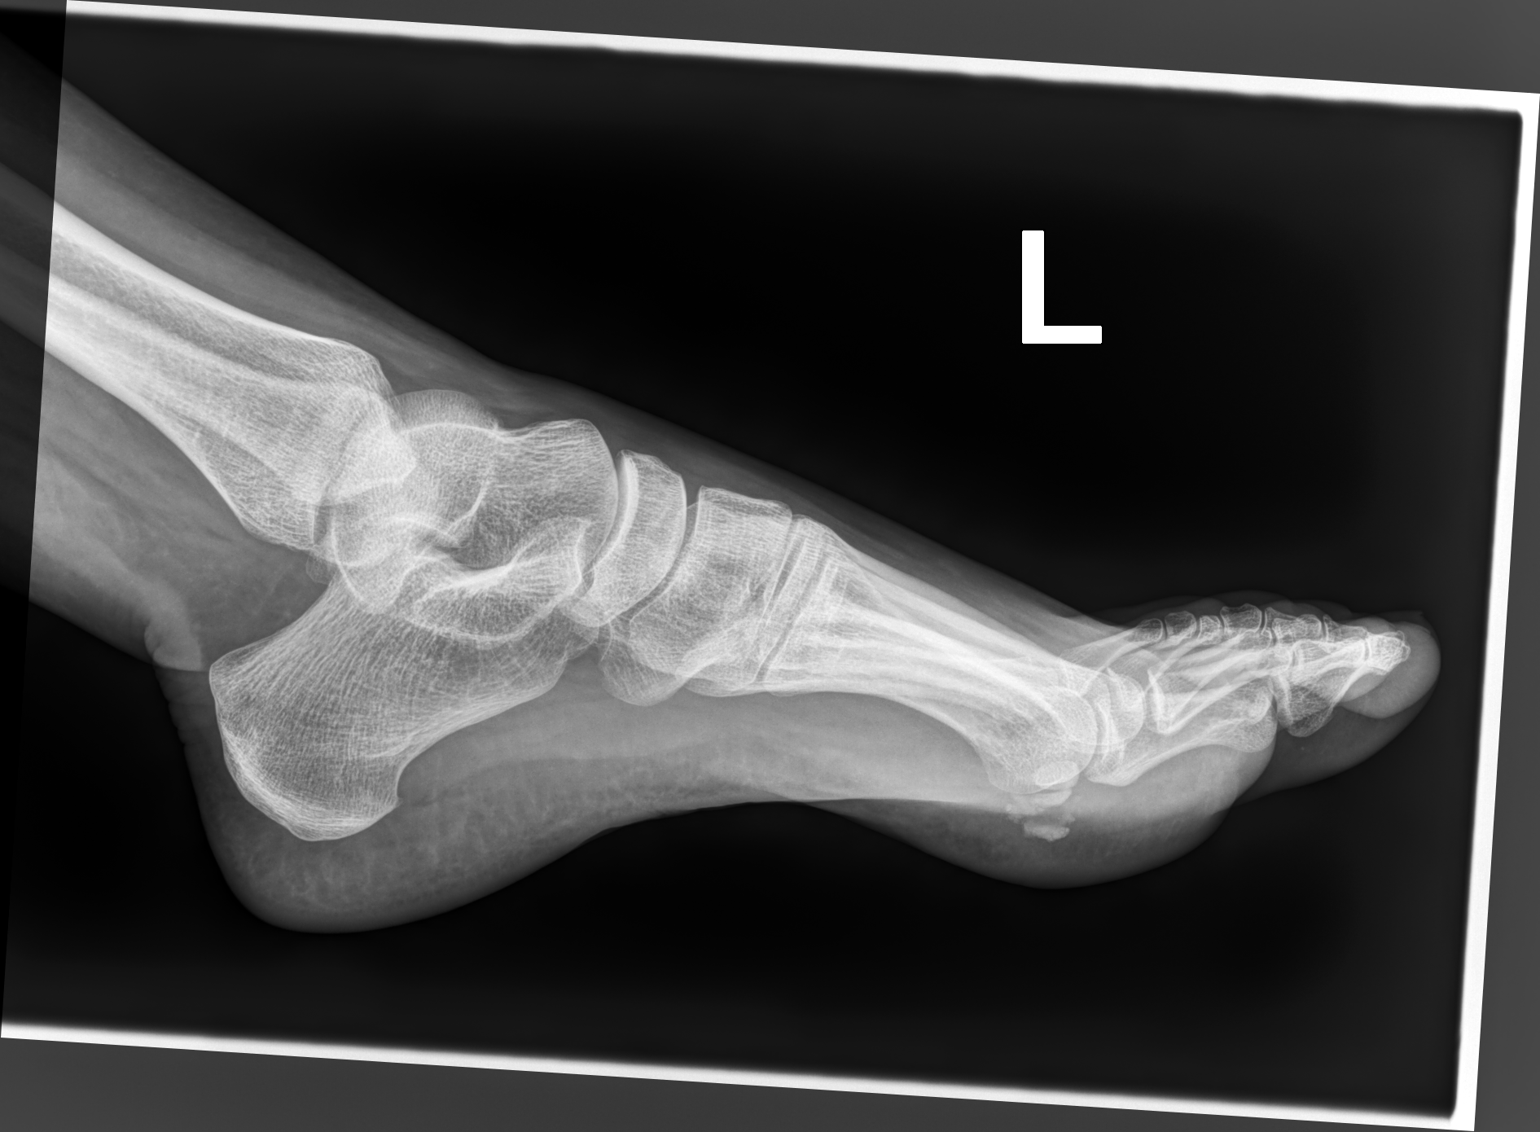

[3 of 3 positions shown; findings below may reference images not displayed]

FINDINGS: There is no evidence of fracture or dislocation. There is no
evidence of arthropathy or other focal bone abnormality. Soft
tissues are unremarkable.
IMPRESSION: Negative.

## 2024-02-07 ENCOUNTER — Encounter: Payer: Self-pay | Admitting: Women's Health

## 2024-02-10 ENCOUNTER — Other Ambulatory Visit (HOSPITAL_COMMUNITY)
Admission: RE | Admit: 2024-02-10 | Discharge: 2024-02-10 | Disposition: A | Discharge status: Home / Self Care | Payer: BLUE CROSS/BLUE SHIELD | Source: Ambulatory Visit | Attending: Obstetrics & Gynecology | Admitting: Obstetrics & Gynecology

## 2024-02-10 ENCOUNTER — Other Ambulatory Visit (INDEPENDENT_AMBULATORY_CARE_PROVIDER_SITE_OTHER): Payer: BLUE CROSS/BLUE SHIELD | Admitting: *Deleted

## 2024-02-10 DIAGNOSIS — N898 Other specified noninflammatory disorders of vagina: Secondary | ICD-10-CM

## 2024-02-10 NOTE — Progress Notes (Signed)
   NURSE VISIT- VAGINITIS/STD  SUBJECTIVE:  Christy Wilkinson is a 38 y.o. [id number] GYN patientfemale here for a vaginal swab for vaginitis screening, STD screen.  She reports the following symptoms:  vaginal odor and discharge  for 6 days. Denies abnormal vaginal bleeding, significant pelvic pain, fever, or UTI symptoms.  OBJECTIVE:  There were no vitals taken for this visit.  Appears well, in no apparent distress  ASSESSMENT: Vaginal swab for vaginitis screening & STD screening  PLAN: Self-collected vaginal probe for Gonorrhea, Chlamydia, Trichomonas, Bacterial Vaginosis, Yeast sent to lab Treatment: to be determined once results are received Follow-up as needed if symptoms persist/worsen, or new symptoms develop. Pt gets cold sores and is requesting something to help.   Malachy Mood  02/10/2024 9:12 AM

## 2024-02-13 LAB — CERVICOVAGINAL ANCILLARY ONLY
Bacterial Vaginitis (gardnerella): NEGATIVE
Candida Glabrata: NEGATIVE
Candida Vaginitis: NEGATIVE
Chlamydia: NEGATIVE
Comment: NEGATIVE
Comment: NEGATIVE
Comment: NEGATIVE
Comment: NEGATIVE
Comment: NEGATIVE
Comment: NORMAL
Neisseria Gonorrhea: NEGATIVE
Trichomonas: NEGATIVE

## 2024-02-15 ENCOUNTER — Other Ambulatory Visit: Payer: Self-pay | Admitting: Adult Health

## 2024-02-15 MED ORDER — VALACYCLOVIR HCL 1 G PO TABS: ORAL_TABLET | ORAL | 1 refills | Status: AC

## 2024-02-15 NOTE — Progress Notes (Signed)
Rx valtrex.  

## 2024-03-29 ENCOUNTER — Encounter: Payer: Self-pay | Admitting: Women's Health

## 2024-03-29 ENCOUNTER — Ambulatory Visit: Payer: BLUE CROSS/BLUE SHIELD | Admitting: Women's Health

## 2024-03-29 VITALS — BP 110/71 | HR 88 | Ht 63.0 in | Wt 190.0 lb

## 2024-03-29 DIAGNOSIS — Z01419 Encounter for gynecological examination (general) (routine) without abnormal findings: Secondary | ICD-10-CM

## 2024-03-29 NOTE — Progress Notes (Signed)
 WELL-WOMAN EXAMINATION Patient name: Christy Wilkinson MRN [id number]  Date of birth: 1986-10-20 Chief Complaint:   Gynecologic Exam (Check iud string)  History of Present Illness:   Christy Wilkinson is a 38 y.o. G21P2012 Caucasian female being seen today for a routine well-woman exam.  Current complaints: odor x 3-4d q time after sex, just started Happy V (pre/probiotic)  PCP: none      does not desire labs Patient's last menstrual period was 03/13/2024. The current method of family planning is IUD. Liletta inserted 04/21/18 Last pap 03/29/23. Results were: NILM w/ HRHPV negative. H/O abnormal pap: yes Last mammogram: never. Results were: N/A. Family h/o breast cancer: no Last colonoscopy: never. Results were: N/A. Family h/o colorectal cancer: no     03/29/2024    8:42 AM 03/29/2023   10:57 AM 02/18/2022   11:10 AM 09/22/2020    2:23 PM 02/20/2018    4:13 PM  Depression screen PHQ 2/9  Decreased Interest 0 0 0 0 0  Down, Depressed, Hopeless 0 0 0 0 0  PHQ - 2 Score 0 0 0 0 0  Altered sleeping 1 1 0 1   Tired, decreased energy 1 0 1 1   Change in appetite 0 0 0 0   Feeling bad or failure about yourself  0 0 0 0   Trouble concentrating 0 0 0 0   Moving slowly or fidgety/restless 0 0 0 0   Suicidal thoughts 0 0 0 0   PHQ-9 Score 2 1 1 2          03/29/2024    8:42 AM 03/29/2023   10:57 AM 02/18/2022   11:11 AM 09/22/2020    2:23 PM  GAD 7 : Generalized Anxiety Score  Nervous, Anxious, on Edge 0 1 0 0  Control/stop worrying 0 0 0 0  Worry too much - different things 0 0 0 1  Trouble relaxing 0 0 0 0  Restless 0 0 0 0  Easily annoyed or irritable 0 0 0 0  Afraid - awful might happen 0 0 0 0  Total GAD 7 Score 0 1 0 1     Review of Systems:   Pertinent items are noted in HPI Denies any headaches, blurred vision, fatigue, shortness of breath, chest pain, abdominal pain, abnormal vaginal discharge/itching/odor/irritation, problems with periods, bowel movements, urination, or intercourse  unless otherwise stated above. Pertinent History Reviewed:  Reviewed past medical,surgical, social and family history.  Reviewed problem list, medications and allergies. Physical Assessment:   Vitals:   03/29/24 0837  BP: 110/71  Pulse: 88  Weight: 190 lb (86.2 kg)  Height: 5\' 3"  (1.6 m)  Body mass index is 33.66 kg/m.        Physical Examination:   General appearance - well appearing, and in no distress  Mental status - alert, oriented to person, place, and time  Psych:  She has a normal mood and affect  Skin - warm and dry, normal color, no suspicious lesions noted  Chest - effort normal, all lung fields clear to auscultation bilaterally  Heart - normal rate and regular rhythm  Neck:  midline trachea, no thyromegaly or nodules  Breasts - breasts appear normal, no suspicious masses, no skin or nipple changes or  axillary nodes  Abdomen - soft, nontender, nondistended, no masses or organomegaly  Pelvic - VULVA: normal appearing vulva with no masses, tenderness or lesions  VAGINA: normal appearing vagina with normal color and discharge, no lesions  CERVIX: normal appearing cervix without discharge or lesions, no CMT, IUD strings visible, appropriate length  Thin prep pap is not done   UTERUS: uterus is felt to be normal size, shape, consistency and nontender   ADNEXA: No adnexal masses or tenderness noted.  Extremities:  No swelling or varicosities noted  Chaperone: Latisha Cresenzo  No results found for this or any previous visit (from the past 24 hours).  Assessment & Plan:  1) Well-Woman Exam  2) Likely postcoital BV> if HappyV doesn't help, let me know and will rx metrogel to use after sex  Labs/procedures today: exam  Mammogram: @ 37yo, or sooner if problems Colonoscopy: @ 37yo, or sooner if problems  No orders of the defined types were placed in this encounter.   Meds: No orders of the defined types were placed in this encounter.   Follow-up: Return in about 1  year (around 03/29/2025) for Pap & physical.  Cheral Marker CNM, [facility] 03/29/2024 8:55 AM

## 2024-05-10 ENCOUNTER — Other Ambulatory Visit: Payer: Self-pay | Admitting: Women's Health

## 2024-09-13 ENCOUNTER — Ambulatory Visit (INDEPENDENT_AMBULATORY_CARE_PROVIDER_SITE_OTHER)

## 2024-09-13 ENCOUNTER — Encounter: Payer: Self-pay | Admitting: Women's Health

## 2024-09-13 ENCOUNTER — Other Ambulatory Visit (HOSPITAL_COMMUNITY)
Admission: RE | Admit: 2024-09-13 | Discharge: 2024-09-13 | Disposition: A | Discharge status: Home / Self Care | Source: Ambulatory Visit | Attending: Obstetrics & Gynecology | Admitting: Obstetrics & Gynecology

## 2024-09-13 DIAGNOSIS — N898 Other specified noninflammatory disorders of vagina: Secondary | ICD-10-CM

## 2024-09-13 NOTE — Progress Notes (Signed)
   NURSE VISIT- VAGINITIS  SUBJECTIVE:  Christy Wilkinson is a 38 y.o. [id number] GYN patientfemale here for a vaginal swab for vaginitis screening.  She reports the following symptoms: burning, local irritation, and vulvar itching for 3 days. Denies abnormal vaginal bleeding, significant pelvic pain, fever, or UTI symptoms.  OBJECTIVE:  There were no vitals taken for this visit.  Appears well, in no apparent distress  ASSESSMENT: Vaginal swab for vaginitis screening  PLAN: Self-collected vaginal probe for Bacterial Vaginosis, Yeast sent to lab Treatment: to be determined once results are received Follow-up as needed if symptoms persist/worsen, or new symptoms develop  Aleck FORBES Blase  09/13/2024 4:04 PM

## 2024-09-17 ENCOUNTER — Ambulatory Visit: Payer: Self-pay | Admitting: Adult Health

## 2024-09-17 LAB — CERVICOVAGINAL ANCILLARY ONLY
Bacterial Vaginitis (gardnerella): NEGATIVE
Candida Glabrata: NEGATIVE
Candida Vaginitis: POSITIVE — AB
Comment: NEGATIVE
Comment: NEGATIVE
Comment: NEGATIVE

## 2024-09-17 MED ORDER — FLUCONAZOLE 150 MG PO TABS: ORAL_TABLET | ORAL | 1 refills | Status: AC
